# Patient Record
Sex: Male | Born: 1945 | Race: White | Hispanic: No | Marital: Married | Smoking: Former smoker
Health system: Southern US, Community
[De-identification: ages and names within clinical notes are randomized; demographics above are authoritative.]

## PROBLEM LIST (undated history)

## (undated) DIAGNOSIS — R42 Dizziness and giddiness: Secondary | ICD-10-CM

## (undated) DIAGNOSIS — C61 Malignant neoplasm of prostate: Secondary | ICD-10-CM

## (undated) HISTORY — DX: Malignant neoplasm of prostate: C61

## (undated) HISTORY — PX: TONSILECTOMY, ADENOIDECTOMY, BILATERAL MYRINGOTOMY AND TUBES: SHX2538

## (undated) HISTORY — PX: COLONOSCOPY: SHX174

## (undated) HISTORY — PX: OTHER SURGICAL HISTORY: SHX169

## (undated) HISTORY — PX: CATARACT EXTRACTION: SUR2

## (undated) HISTORY — PX: DG GALL BLADDER: HXRAD326

## (undated) HISTORY — PX: VASECTOMY: SHX75

## (undated) HISTORY — PX: HEMORRHOID SURGERY: SHX153

## (undated) HISTORY — PX: BICEPS TENDON REPAIR: SHX566

## (undated) HISTORY — DX: Dizziness and giddiness: R42

---

## 1998-05-18 ENCOUNTER — Encounter: Payer: Self-pay | Admitting: Neurosurgery

## 1998-05-18 ENCOUNTER — Ambulatory Visit (HOSPITAL_COMMUNITY): Admission: RE | Admit: 1998-05-18 | Discharge: 1998-05-18 | Payer: Self-pay | Admitting: Neurosurgery

## 2001-04-05 ENCOUNTER — Encounter: Payer: Self-pay | Admitting: Gastroenterology

## 2001-04-05 ENCOUNTER — Ambulatory Visit (HOSPITAL_COMMUNITY): Admission: RE | Admit: 2001-04-05 | Discharge: 2001-04-05 | Payer: Self-pay | Admitting: Gastroenterology

## 2001-04-19 ENCOUNTER — Ambulatory Visit (HOSPITAL_COMMUNITY): Admission: RE | Admit: 2001-04-19 | Discharge: 2001-04-19 | Payer: Self-pay | Admitting: Gastroenterology

## 2001-04-19 ENCOUNTER — Encounter: Payer: Self-pay | Admitting: Gastroenterology

## 2001-05-03 ENCOUNTER — Ambulatory Visit (HOSPITAL_COMMUNITY): Admission: RE | Admit: 2001-05-03 | Discharge: 2001-05-03 | Payer: Self-pay | Admitting: Gastroenterology

## 2001-11-05 ENCOUNTER — Encounter: Payer: Self-pay | Admitting: General Surgery

## 2001-11-05 ENCOUNTER — Encounter: Admission: RE | Admit: 2001-11-05 | Discharge: 2001-11-05 | Payer: Self-pay | Admitting: General Surgery

## 2002-12-05 ENCOUNTER — Ambulatory Visit: Admission: RE | Admit: 2002-12-05 | Discharge: 2003-03-05 | Payer: Self-pay | Admitting: Radiation Oncology

## 2003-02-28 ENCOUNTER — Emergency Department (HOSPITAL_COMMUNITY): Admission: EM | Admit: 2003-02-28 | Discharge: 2003-02-28 | Payer: Self-pay | Admitting: Emergency Medicine

## 2007-09-18 ENCOUNTER — Encounter (INDEPENDENT_AMBULATORY_CARE_PROVIDER_SITE_OTHER): Payer: Self-pay | Admitting: Surgery

## 2007-09-18 ENCOUNTER — Ambulatory Visit (HOSPITAL_COMMUNITY): Admission: RE | Admit: 2007-09-18 | Discharge: 2007-09-19 | Payer: Self-pay | Admitting: Surgery

## 2008-11-27 ENCOUNTER — Ambulatory Visit (HOSPITAL_COMMUNITY): Admission: RE | Admit: 2008-11-27 | Discharge: 2008-11-27 | Payer: Self-pay | Admitting: Family Medicine

## 2009-01-28 ENCOUNTER — Encounter (INDEPENDENT_AMBULATORY_CARE_PROVIDER_SITE_OTHER): Payer: Self-pay | Admitting: Surgery

## 2009-01-28 ENCOUNTER — Ambulatory Visit (HOSPITAL_COMMUNITY): Admission: RE | Admit: 2009-01-28 | Discharge: 2009-01-28 | Payer: Self-pay | Admitting: Surgery

## 2009-02-01 ENCOUNTER — Inpatient Hospital Stay (HOSPITAL_COMMUNITY): Admission: EM | Admit: 2009-02-01 | Discharge: 2009-02-03 | Payer: Self-pay | Admitting: Emergency Medicine

## 2009-02-01 ENCOUNTER — Ambulatory Visit: Payer: Self-pay | Admitting: Family Medicine

## 2009-04-16 ENCOUNTER — Ambulatory Visit (HOSPITAL_BASED_OUTPATIENT_CLINIC_OR_DEPARTMENT_OTHER): Admission: RE | Admit: 2009-04-16 | Discharge: 2009-04-17 | Payer: Self-pay | Admitting: Urology

## 2010-10-08 LAB — URINALYSIS, ROUTINE W REFLEX MICROSCOPIC
Glucose, UA: NEGATIVE mg/dL
Ketones, ur: 15 mg/dL — AB
Specific Gravity, Urine: 1.012 (ref 1.005–1.030)
pH: 5.5 (ref 5.0–8.0)

## 2010-10-08 LAB — URINE MICROSCOPIC-ADD ON

## 2010-10-08 LAB — COMPREHENSIVE METABOLIC PANEL
ALT: 33 U/L (ref 0–53)
AST: 22 U/L (ref 0–37)
Albumin: 3.6 g/dL (ref 3.5–5.2)
Alkaline Phosphatase: 70 U/L (ref 39–117)
BUN: 46 mg/dL — ABNORMAL HIGH (ref 6–23)
Chloride: 105 mEq/L (ref 96–112)
GFR calc Af Amer: 16 mL/min — ABNORMAL LOW (ref >60)
Potassium: 4.2 mEq/L (ref 3.5–5.1)
Sodium: 136 mEq/L (ref 135–145)
Total Bilirubin: 1.3 mg/dL — ABNORMAL HIGH (ref 0.3–1.2)

## 2010-10-08 LAB — BASIC METABOLIC PANEL
BUN: 11 mg/dL (ref 6–23)
CO2: 24 mEq/L (ref 19–32)
Calcium: 8.2 mg/dL — ABNORMAL LOW (ref 8.4–10.5)
Chloride: 106 mEq/L (ref 96–112)
Creatinine, Ser: 1.03 mg/dL (ref 0.4–1.5)
GFR calc Af Amer: >60 mL/min (ref >60)
GFR calc non Af Amer: >60 mL/min (ref >60)
Glucose, Bld: 96 mg/dL (ref 70–99)
Potassium: 4.2 mEq/L (ref 3.5–5.1)
Sodium: 136 mEq/L (ref 135–145)

## 2010-10-08 LAB — CBC
HCT: 39.2 % (ref 39.0–52.0)
HCT: 41.8 % (ref 39.0–52.0)
Hemoglobin: 13.5 g/dL (ref 13.0–17.0)
MCHC: 34.5 g/dL (ref 30.0–36.0)
MCV: 91.4 fL (ref 78.0–100.0)
MCV: 91.9 fL (ref 78.0–100.0)
Platelets: 165 10*3/uL (ref 150–400)
Platelets: 178 10*3/uL (ref 150–400)
Platelets: 193 10*3/uL (ref 150–400)
RBC: 4.26 MIL/uL (ref 4.22–5.81)
RBC: 4.51 MIL/uL (ref 4.22–5.81)
RDW: 13.2 % (ref 11.5–15.5)
WBC: 10.1 10*3/uL (ref 4.0–10.5)
WBC: 6 10*3/uL (ref 4.0–10.5)
WBC: 6.2 10*3/uL (ref 4.0–10.5)

## 2010-10-08 LAB — PROTIME-INR
INR: 1.2 (ref 0.00–1.49)
Prothrombin Time: 15.5 seconds — ABNORMAL HIGH (ref 11.6–15.2)

## 2010-10-08 LAB — URINE CULTURE
Colony Count: NO GROWTH
Culture: NO GROWTH

## 2010-10-08 LAB — DIFFERENTIAL
Basophils Absolute: 0 10*3/uL (ref 0.0–0.1)
Basophils Relative: 0 % (ref 0–1)
Eosinophils Absolute: 0.2 10*3/uL (ref 0.0–0.7)
Eosinophils Relative: 2 % (ref 0–5)
Lymphocytes Relative: 5 % — ABNORMAL LOW (ref 12–46)
Lymphs Abs: 0.5 10*3/uL — ABNORMAL LOW (ref 0.7–4.0)
Monocytes Absolute: 0.7 10*3/uL (ref 0.1–1.0)
Monocytes Relative: 7 % (ref 3–12)
Neutro Abs: 8.8 10*3/uL — ABNORMAL HIGH (ref 1.7–7.7)
Neutrophils Relative %: 86 % — ABNORMAL HIGH (ref 43–77)

## 2010-10-08 LAB — RENAL FUNCTION PANEL
Calcium: 8.2 mg/dL — ABNORMAL LOW (ref 8.4–10.5)
Creatinine, Ser: 1.24 mg/dL (ref 0.4–1.5)
GFR calc Af Amer: >60 mL/min (ref >60)
GFR calc non Af Amer: 59 mL/min — ABNORMAL LOW (ref >60)
Phosphorus: 2.3 mg/dL (ref 2.3–4.6)
Sodium: 139 mEq/L (ref 135–145)

## 2010-10-08 LAB — APTT: aPTT: 31 seconds (ref 24–37)

## 2010-10-09 LAB — COMPREHENSIVE METABOLIC PANEL
Albumin: 3.9 g/dL (ref 3.5–5.2)
Alkaline Phosphatase: 69 U/L (ref 39–117)
BUN: 11 mg/dL (ref 6–23)
CO2: 22 mEq/L (ref 19–32)
Chloride: 110 mEq/L (ref 96–112)
Creatinine, Ser: 1.01 mg/dL (ref 0.4–1.5)
GFR calc non Af Amer: >60 mL/min (ref >60)
Glucose, Bld: 81 mg/dL (ref 70–99)
Potassium: 4.2 mEq/L (ref 3.5–5.1)
Total Bilirubin: 0.8 mg/dL (ref 0.3–1.2)

## 2010-10-09 LAB — DIFFERENTIAL
Basophils Absolute: 0 10*3/uL (ref 0.0–0.1)
Basophils Relative: 0 % (ref 0–1)
Lymphocytes Relative: 19 % (ref 12–46)
Monocytes Absolute: 0.6 10*3/uL (ref 0.1–1.0)
Neutro Abs: 4 10*3/uL (ref 1.7–7.7)
Neutrophils Relative %: 68 % (ref 43–77)

## 2010-10-09 LAB — CBC
HCT: 43.5 % (ref 39.0–52.0)
Hemoglobin: 14.9 g/dL (ref 13.0–17.0)
MCV: 90.8 fL (ref 78.0–100.0)
RBC: 4.79 MIL/uL (ref 4.22–5.81)
WBC: 5.9 10*3/uL (ref 4.0–10.5)

## 2010-10-13 IMAGING — CR DG CHEST 2V
2 series · 2 of 2 positions shown · non-contrast
Comparison: None

CLINICAL DATA: Preop radiograph.  Laparoscopic cholecystectomy.

CHEST - 2 VIEW

[view not recorded (1 of 2)]
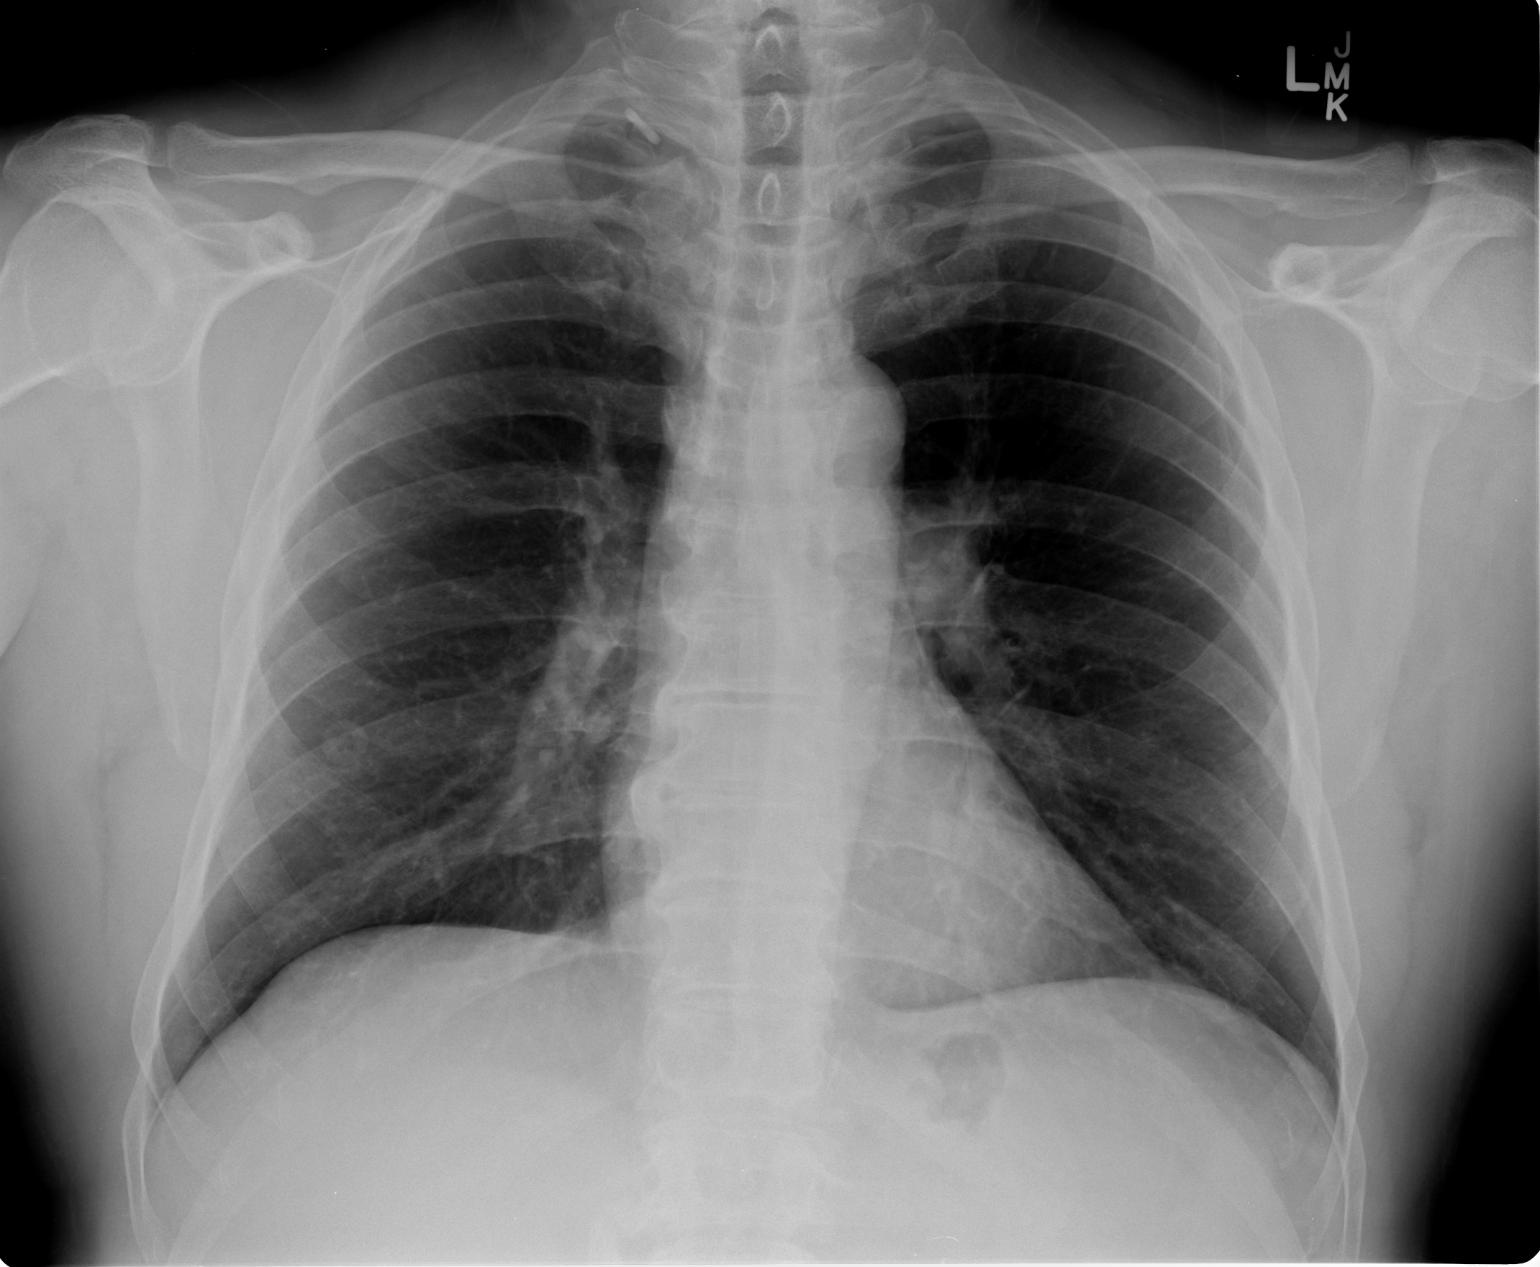

[view not recorded (2 of 2)]
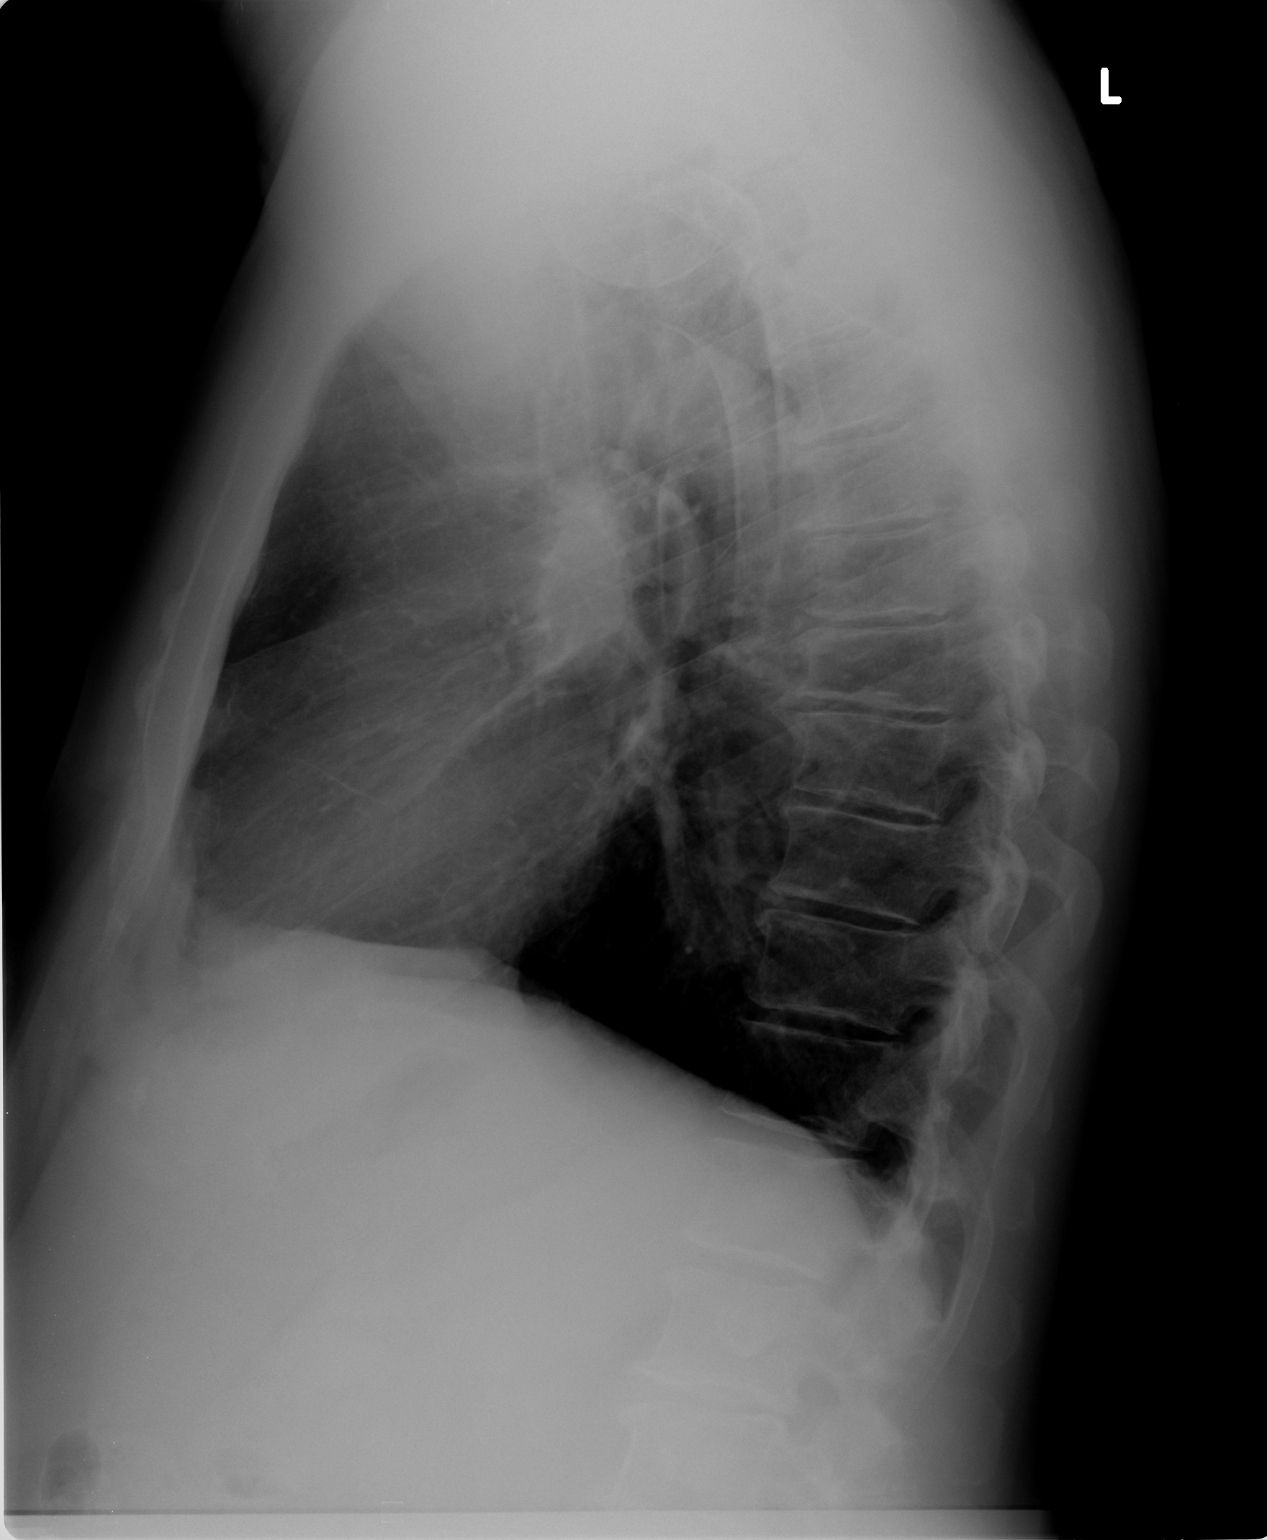

[2 of 2 positions shown; findings below may reference images not displayed]

FINDINGS: The heart size and mediastinal contours are within normal
limits.  Both lungs are clear.  The visualized skeletal structures
are unremarkable.
IMPRESSION: No acute findings.

## 2010-10-15 IMAGING — RF DG CHOLANGIOGRAM OPERATIVE
1 series · 6 of 6 positions shown · non-contrast
Comparison: none

CLINICAL DATA: Cholecystectomy

INTRAOPERATIVE CHOLANGIOGRAM
TECHNIQUE: Multiple fluoroscopic spot radiographs were obtained
during intraoperative cholangiogram and are submitted for
interpretation post-operatively.
Fluoroscopy Time: 9 seconds

[Series 1: run · 3 acquisitions, 6 frames shown]
[im 1/3]
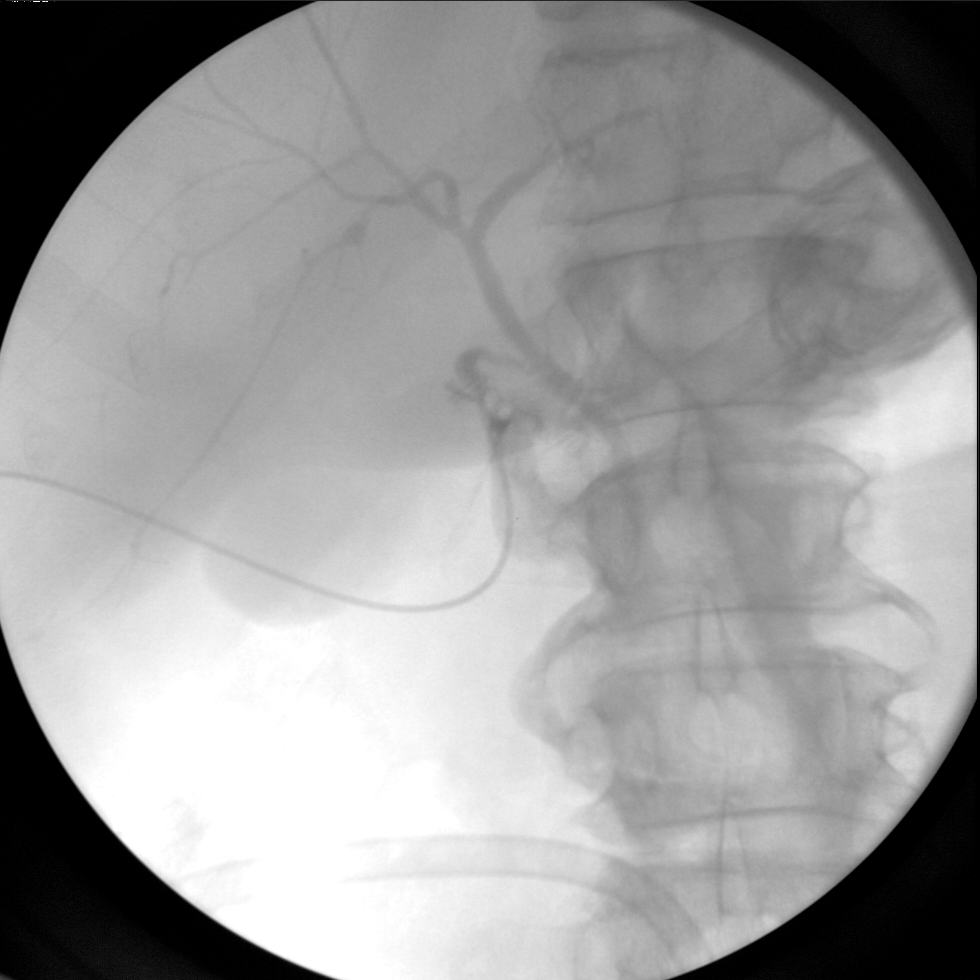
[im 1/3]
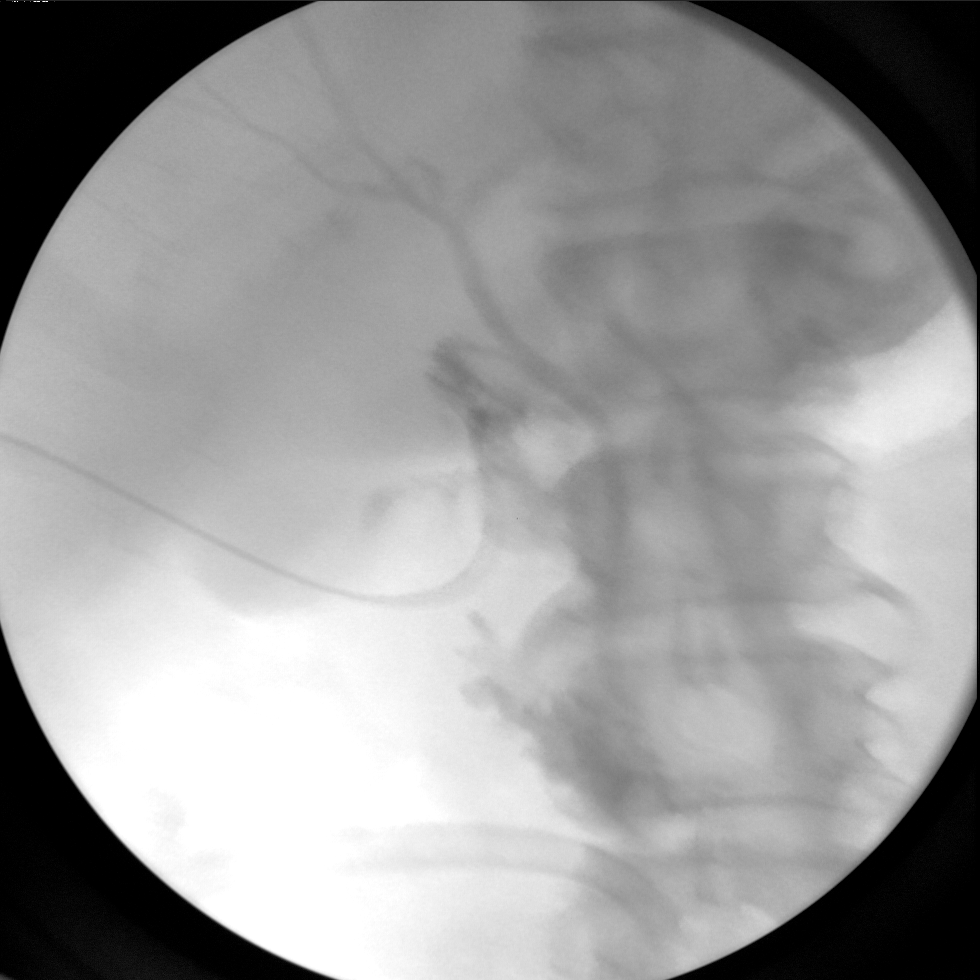
[im 1/3]
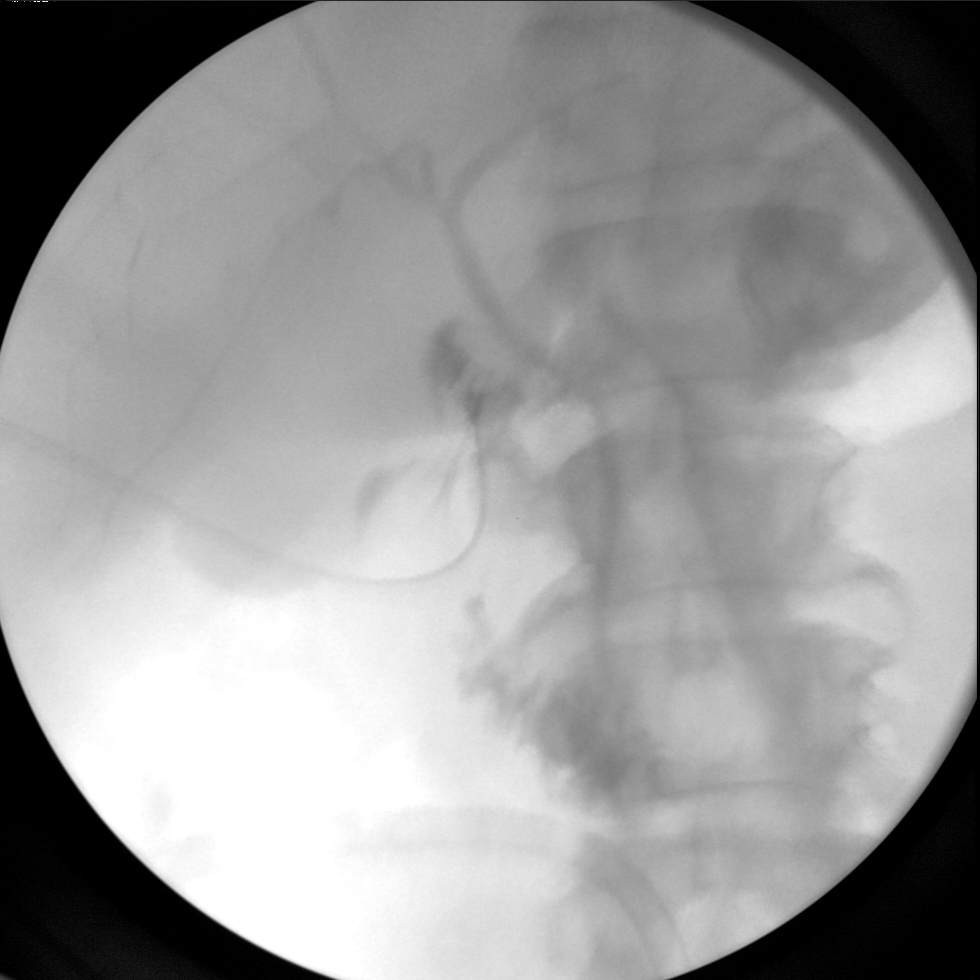
[im 1/3]
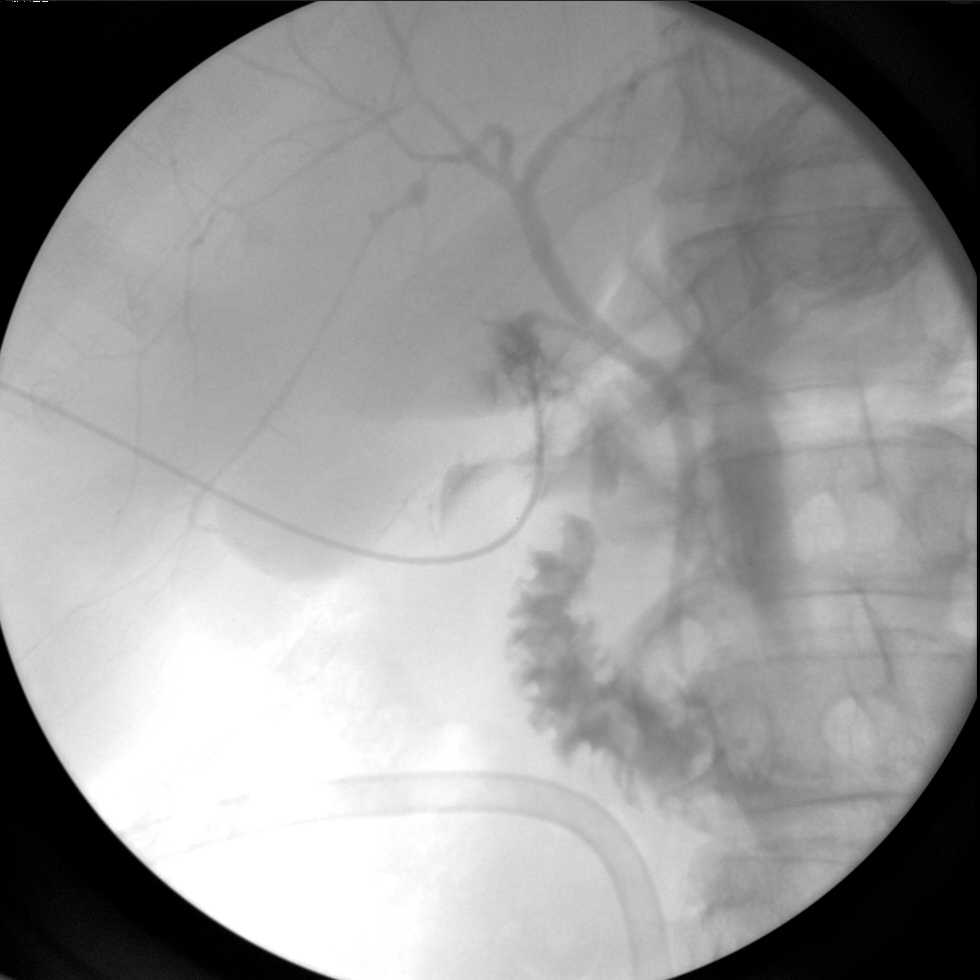
[im 2/3]
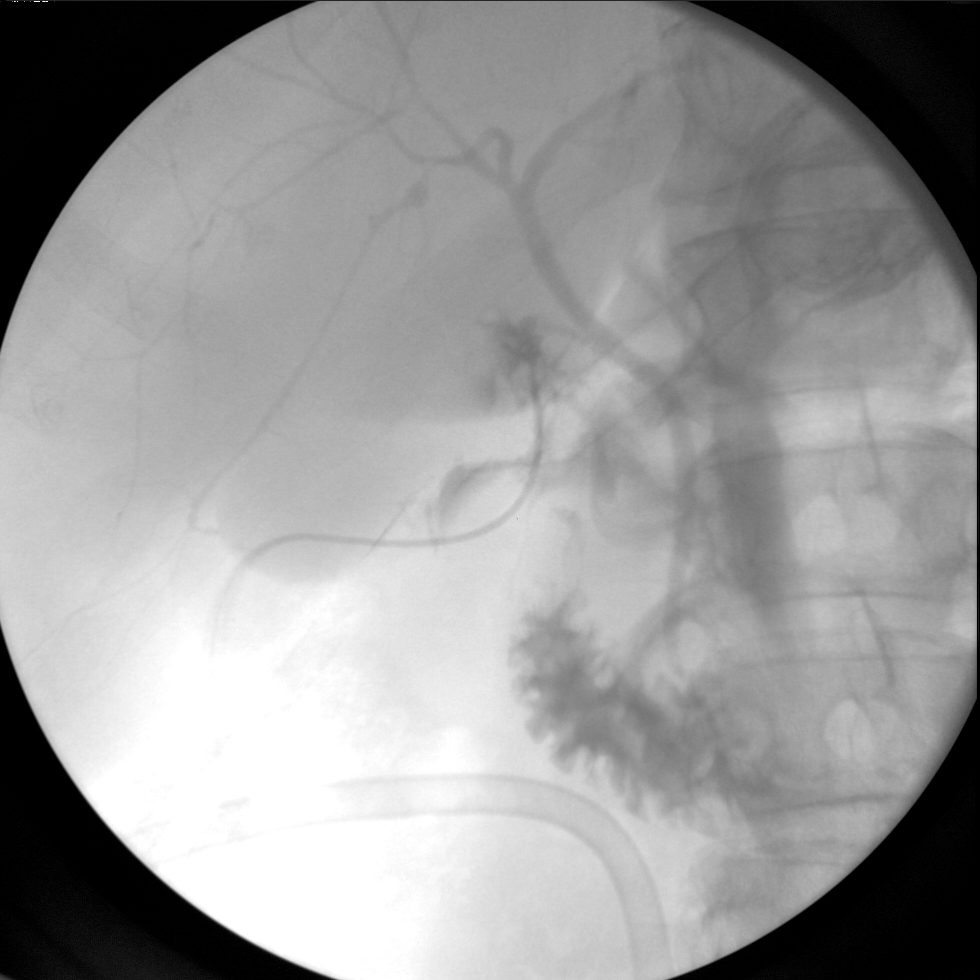
[im 3/3]
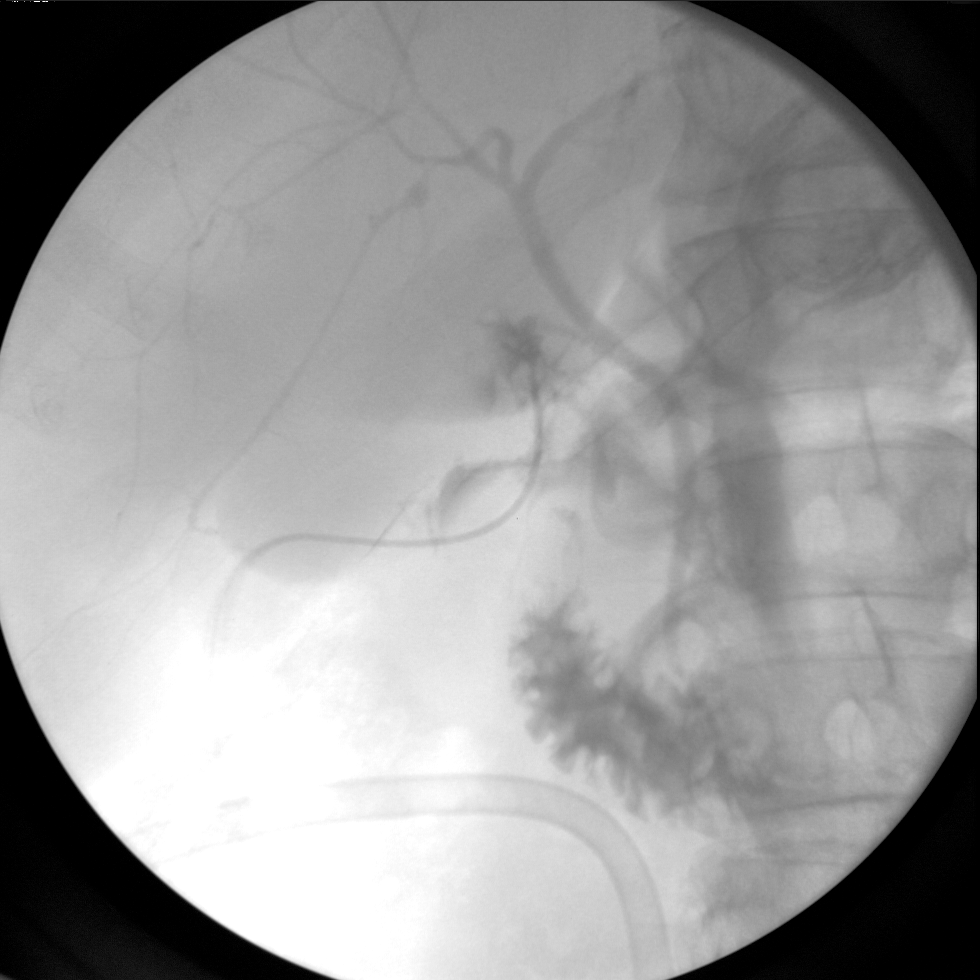

[6 of 6 positions shown; findings below may reference images not displayed]

FINDINGS: Contrast fills the biliary tree and duodenum.  No filling
defects are present in the common bile duct or common hepatic duct
to suggest duct stones.
IMPRESSION: Biliary system is patent.  No definite duct stones.

## 2010-11-15 NOTE — Discharge Summary (Signed)
NAMEYUTO, CAJUSTE               ACCOUNT NO.:  [id number]   MEDICAL RECORD NO.:  [id number]          PATIENT TYPE:  INP   LOCATION:  5120                         FACILITY:  [facility]   PHYSICIAN:  Wayne A. Sheffield Slider, M.D.    DATE OF BIRTH:  1945/08/30   DATE OF ADMISSION:  02/01/2009  DATE OF DISCHARGE:  02/03/2009                               DISCHARGE SUMMARY   PRIMARY CARE PHYSICIAN:  Unassigned.   DISCHARGE DIAGNOSES:  1. Acute renal failure.  2. Urinary retention.  3. Constipation.   DISCHARGE MEDICATIONS:  1. Flomax 0.8 mg p.o. daily.  2. Avodart 0.5 mg p.o. daily.   CONSULTATIONS:  None.   PROCEDURE:  Foley catheter placed February 01, 2009.   LABORATORY:  1. Creatinine 4.60, total bilirubin 1.3, urine ketones 15, urine blood      in large amounts, urine leukocytes trace.  2. 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   BRIEF HOSPITAL COURSE:  1. Acute renal failure.  The patient has significant history of severe      BPH and prostate cancer.  He is status post a laparoscopic      cholecystectomy on January 28, 2009.  The patient was admitted to the      hospital with severe abdominal pain, reduced urine output and      constipation.  We were concerned about a urinary obstruction.  Labs      done were a CMET, CBC with differential, urinalysis, PT, PTT and      PSA.  The patient was also catheterized with a Foley.  As per      laboratory results and Foley catheterization, which allowed the      patient to properly urinate, the patient was determined to be in      obstructive acute renal failure.  We hydrated the patient with IV      fluids and followed up on a renal ultrasound.  Over the next few      days, the patient's creatinine level returned to normal and the      patient was able to tolerate a diet.  The patient is to follow up      with Urology.  2. Urinary retention.  As mentioned above, the patient was able to      void  with the catheter.  We desired for the patient to keep Foley      in to help void due to his possible reduced tone; however, the      patient refused to keep the Foley in.  We discontinued his Foley in      the hospital and made a follow-up appointment with his urologist.  3. Constipation.  The patient had been constipated since a week before      emergency department presentation.  On day 2 of the hospital stay,      the patient began to defecate as his urinary retention was being      resolved.   DISCHARGE INSTRUCTIONS:  1. Activity:  No restrictions.  2. Diet:  No  restrictions.  3. No wound care.  4. The patient is to follow up with his urologist, [facility] in [place].   No pending results or issues to be followed up after discharge.   FOLLOW-UP APPOINTMENTS:  The patient has an appointment with Cammy Copa, nurse practitioner at [facility] on  Thursday, February 04, 2009 at 9:15 a.m.   DISPOSITION:  The patient was discharged home in stable condition.      Magnus Ivan, MD  Electronically Signed      Arnette Norris. Sheffield Slider, M.D.  Electronically Signed    VW/MEDQ  D:  02/03/2009  T:  02/03/2009  Job:  [id number]   cc:   [facility]

## 2010-11-15 NOTE — H&P (Signed)
NAMEJUNIPER, SNYDERS NO.:  [id number]   MEDICAL RECORD NO.:  [id number]          PATIENT TYPE:  INP   LOCATION:  1844                         FACILITY:  [facility]   PHYSICIAN:  Wayne A. Sheffield Slider, M.D.    DATE OF BIRTH:  January 17, 1946   DATE OF ADMISSION:  02/01/2009  DATE OF DISCHARGE:                              HISTORY & PHYSICAL   CHIEF COMPLAINT:  Acute renal failure, urinary retention and  constipation.   PRIMARY CARE PHYSICIAN:  Unassigned.   UROLOGIST:  Lucrezia Starch. Earlene Plater, MD   SURGEON:  Clovis Pu. Cornett, MD   HISTORY OF PRESENT ILLNESS:  This is a 65 year old male with a history  of severe BPH, prostate cancer, status post laparoscopic cholecystectomy  for biliary dyskinesia on July 29, who presented to the ED today with a  5-day history of worsening abdominal pain, decreased urine output and  decreased stools.  Normally the patient states he has decreased urine  output but over the last few days, it has worsened.  He is still passing  gas.  He has also had decreased p.o. intake secondary to fear of  worsening abdominal pain but not really decreased appetite.  The patient  endorses subjective fevers and nausea but no vomiting.  Denies diarrhea  but does have constipation.  His abdominal pain is characterized as  throughout his abdomen and more pressure-like sensation.  He also  endorses history of flank pain in the past several days but none  currently.  He states his abdominal pain is worsened to the point of not  being able to sleep over the last 2 days and it got so severe that he  came to the ER today to be evaluated.  In the ER he was noted to have  abdominal distention and was catheterized with an immediate voiding of  950 cc and over the last several hours, he has had a total of 2 liters  out.  Per the patient, normally it takes him about several hours, over  5, to fully empty his bladder.  As far as his prostate cancer, his  current PSA is 3 but per  patient previously has been up to 13.  The  patient denies any NSAID use and is not currently on an ACE inhibitor.   REVIEW OF SYSTEMS:  As above, significant for fevers, chills, nausea and  constipation and abdominal pain.  Negative for diarrhea, vomiting, joint  pain, muscle pain, headache, vision changes, chest pain or tightness,  shortness of breath or cough.   PAST MEDICAL HISTORY:  1. Severe BPH.  2. Prostate cancer.  PSA currently at 3.  no history of prostate      surgery.  3. History of TIA.   PAST SURGICAL HISTORY:  1. Appendectomy in March 2009.  2. Laparoscopic cholecystectomy last month.  3. Vertebral fusions in the past. Cervical.  4. Ear surgery in the past.  5. Hemorrhoidectomy x2.   The patient denies prostate surgery.   ALLERGIES:  No significant drug allergies.   MEDICATIONS:  1. Flomax 0.4 mg daily.  2. Avodart 0.5  mg daily  3. Multivitamin and supplements.   FAMILY HISTORY:  Significant for a brother with diabetes.  Parents are  alive and healthy.   SOCIAL HISTORY:  The patient is a Market researcher.  He lives in  [place] with his wife.  He has 3 daughters in the area as well.  He  has a remote history of smoking for 3 years but quit over 30 years ago.  He endorses occasional alcohol use and no recreational drugs.   PHYSICAL EXAMINATION:  Temperature 97.5, pulse rate 72-85, respiratory  rate 20, blood pressure 130-160 over 71-89.  O2 saturation 98% on room  air.  GENERAL:  Pleasant, talkative, in no acute distress.  HEENT:  Pupils equally round and reactive to light.  Extraocular  movements intact.  Moist mucous membranes.  No pharyngeal erythema or  edema.  Cranial nerves II-XII intact.  CARDIOVASCULAR:  Normal S1, S2.  No murmurs, rubs or gallops.  PULMONARY:  Clear to auscultation bilaterally.  No crackles or wheezing.  ABDOMEN:  Soft, tender to palpation suprapubically mostly.  No CVA  tenderness.  Normoactive bowel sounds.  Incision  is healing well without  erythema or induration.  EXTREMITIES:  No clubbing, cyanosis or edema.  No pain.  SKIN:  Ecchymoses suprapubically, otherwise no rash or lesions noted.   LABORATORY DATA:  Urinalysis showing specific gravity of 1.012, ketones  of 15, large blood and trace leukocyte esterase.  White count of 10.1,  hemoglobin 14.5, platelets 193.  Sodium 136, potassium 4.2, chloride  105, bicarb 22, BUN 46, creatinine 4.60, increased from 1.01 on July 27,  glucose 101, total bilirubin 1.3.  LFTs within normal limits.   IMAGING:  Acute abdominal series showing left subsegmental atelectasis,  question of constipation.  No acute findings, however.  There is also  noted a right iliac bone lytic lesion of undetermined significance.   ASSESSMENT/PLAN:  In short, this is a 65 year old male with history of  severe benign prostatic hypertrophy and prostate cancer who presents  with urinary retention and acute renal failure.  1. Acute renal failure, likely secondary to obstruction from acute      urinary retention status post surgery.  I will check a renal      ultrasound and consult his urologist to let him know the patient is      in the hospital.  The patient denies nonsteroidal anti-inflammatory      drug or angiotensin-converting enzyme inhibitor use.  Mr. Dannemiller      has already had a 1-liter bolus in the ER.  I will continue gentle      hydration with 125 cc normal saline per hour.  If it is due to      urinary retention, I would expect his acute renal failure to slowly      but steadily improve and return back to baseline.  Reviewing      records, the patient did have a nuclear medicine imaging of his      gallbladder done in May of this year and then an intraoperative      cholangiogram with some amount of contrast dye load, but I would      not expect such severe renal failure from this alone.  Likely his      benign prostatic hypertrophy is contributing more, causing acute       urinary retention leading to obstruction and renal failure.  2. Urinary retention.  We will leave the Foley in place  for now and      will await urology recommendations.  3. Severe benign prostatic hypertrophy.  Continue Flomax.  Consider      increasing to 0.8 daily, but we will leave this to the discretion      of his urologist.  4. Prostate cancer with questionable new right iliac lytic lesion on      acute abdominal series.  We will make urology aware of this.  5. Fluids, electrolytes, and nutrition, gastrointestinal.  Again,      gently hydrate the patient with normal saline.  I will start the      patient on clear liquid diet and see how he tolerates this.      Consider advancing as tolerated in the next day or two.  Recheck      creatinine tomorrow morning with a.m. labs.  6. Prophylaxis.  Heparin and proton pump inhibitor.  7. Disposition.  Awaiting resolution of acute renal failure and      urology recommendations.      Eustaquio Boyden, MD  Electronically Signed      Arnette Norris. Sheffield Slider, M.D.  Electronically Signed    JG/MEDQ  D:  02/01/2009  T:  02/01/2009  Job:  [id number]

## 2010-11-15 NOTE — H&P (Signed)
NAMEGARON, MELANDER NO.:  [id number]   MEDICAL RECORD NO.:  [id number]          PATIENT TYPE:  OIB   LOCATION:  1522                         FACILITY:  [facility]   PHYSICIAN:  Velora Heckler, MD      DATE OF BIRTH:  1945-11-13   DATE OF ADMISSION:  09/18/2007  DATE OF DISCHARGE:                              HISTORY & PHYSICAL   CHIEF COMPLAINT:  Early appendicitis.   HISTORY OF PRESENT ILLNESS:  Mike James is a 65 year old white male  from [place], [place], who presents to our office on referral  from [facility] with possible early appendicitis.  The patient had been  ill for approximately 5 days.  The patient presented with right lower  quadrant abdominal pain, nausea, and chills.  He monitored this at home  for 2 days.  He was seen on Sunday, March 15 by his primary physician at  [facility].  Symptoms persisted, and laboratory studies were obtained.  He was told that he had a marginal white blood cell count.  He was sent  on March 17 for CT scan of the abdomen and pelvis at [facility]  which showed mild inflammatory changes around the appendix consistent  with possible early acute appendicitis.  The patient was referred to our  office, today, for surgical assessment.   PAST MEDICAL HISTORY:  1. History of benign prostatic hypertrophy.  2. History of hemorrhoidectomy.  3. Status post cervical fusion in 49 in [place], [place].   MEDICATIONS:  Avodart and vitamin supplements.   ALLERGIES:  None known.   SOCIAL HISTORY:  The patient is married.  He does have children.  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  He does not  smoke.  He drinks alcohol on occasion.   FAMILY HISTORY:  Noncontributory.   REVIEW OF SYSTEMS:  A 65 system review documented in our medical record  with no further no significant findings except as noted above.   PHYSICAL EXAMINATION:  GENERAL:  A 65 year old well-developed, well-  nourished  white male in mild discomfort.  Temperature 97.4.  HEENT:  Shows him to be normocephalic, atraumatic.  Sclerae clear.  Conjunctiva clear.  Pupils equal and reactive.  Dentition fair.  Mucous  membranes moist.  Voice normal.  NECK:  Palpation of the neck shows no lymphadenopathy.  No tenderness.  No thyroid nodularity.  LUNGS:  Clear to auscultation bilaterally.  CARDIAC:  Exam shows regular rate and rhythm without murmur.  Peripheral  pulses are full.  EXTREMITIES:  Nontender without edema.  ABDOMEN:  Soft without distention.  There are a few bowel sounds  present.  There is a reducible umbilical hernia which is nontender.  Upper abdomen is soft, nontender without hepatosplenomegaly.  There is  tenderness to palpation in the right lower quadrant with voluntary  guarding.  There is rebound tenderness in the right lower quadrant.  NEUROLOGICALLY:  The patient is alert and oriented.  There is no sign of  tremor.   DATA REVIEWED:  Laboratory studies are pending at the time of dictation.  They will  be drawn in the emergency department of [facility].   Radiographic studies CT scan of the abdomen and pelvis from September 17, 2007, at [facility] showing a in the right lower quadrant the  appendix appears minimally inflamed and  abnormally dilated, and is  short and length.   IMPRESSION:  Probable acute appendicitis.   PLAN:  I have discussed the case with my partner, Dr. Harriette Bouillon,  who is on call at [facility].  He has agreed to see  the patient.  The patient will be sent to same-day admissions for  laboratory studies, and initiation of intravenous fluids, intravenous  pain medication, and intravenous antibiotics.  Dr. Luisa Hart agrees with  me that the patient requires surgical evaluation with diagnostic  laparoscopy, and probable laparoscopic appendectomy.   Risk and benefits of the procedure been discussed with the patient.  He   understands and agrees to proceed.      Velora Heckler, MD  Electronically Signed     TMG/MEDQ  D:  09/18/2007  T:  09/19/2007  Job:  [id number]   cc:   Gabriel Earing, M.D.  Fax: [phone]   Clovis Pu. Cornett, M.D.  [address] [place] [address]  [place] [address]

## 2010-11-15 NOTE — Discharge Summary (Signed)
NAMEKINGSTIN, HEIMS NO.:  [id number]   MEDICAL RECORD NO.:  [id number]          PATIENT TYPE:  OIB   LOCATION:  1522                         FACILITY:  [facility]   PHYSICIAN:  Clovis Pu. Cornett, M.D.DATE OF BIRTH:  Mar 08, 1946   DATE OF ADMISSION:  09/18/2007  DATE OF DISCHARGE:  09/19/2007                               DISCHARGE SUMMARY   ADMISSION DIAGNOSIS:  Acute appendicitis.   DISCHARGE DIAGNOSIS:  Acute appendicitis.   BRIEF HISTORY:  The patient is a 65 year old male who has had left lower  quadrant pain for three to four days.  He was seen yesterday at the  urgent office with Dr. Gerrit Friends.  CT scan was obtained at [facility] the  day before which showed acute appendicitis prior to referral to our  office.  He was then sent to [facility] and taken to the  operating room for laparoscopic appendectomy for acute appendicitis.   HOSPITAL COURSE:  Hospital course was unremarkable.  On postoperative  day 1 he was ambulating.  He was having some problems with urinary  frequency.  This is now new for him, given his history of benign  prostatic hypertrophy.  He is on Avodart and I went ahead and placed him  on Flomax to see if we could help his bladder empty better.  I told him  if he could do that we would discharge him to home later that day which  we did.  He was tolerating his diet, ambulating well, was afebrile and  was ready to go home.   DISCHARGE INSTRUCTIONS:  I will have him come back to see me in two to  three weeks.  I will give him a 5 day supply of Flomax to try to see if  this helps his bladder empty better.  He is already on Avodart for  benign prostatic hypertrophy.  He will be given a script for Vicodin ES  one to two tablets q.4h. p.r.n. pain as  needed and ambulate as  tolerated.  He will refrain from lifting and driving for a week and he  may shower and resume a regular diet.   CONDITION ON DISCHARGE:  Improved.      Thomas A.  Cornett, M.D.  Electronically Signed     TAC/MEDQ  D:  09/19/2007  T:  09/19/2007  Job:  [id number]

## 2010-11-15 NOTE — Op Note (Signed)
NAMEISLAM, Mike James NO.:  [id number]   MEDICAL RECORD NO.:  [id number]          PATIENT TYPE:  AMB   LOCATION:  DAY                          FACILITY:  [facility]   PHYSICIAN:  Thomas A. Cornett, M.D.DATE OF BIRTH:  Jun 23, 1946   DATE OF PROCEDURE:  09/18/2007  DATE OF DISCHARGE:                               OPERATIVE REPORT   PREOPERATIVE DIAGNOSIS:  Acute appendicitis.   POSTOPERATIVE DIAGNOSIS:  Acute appendicitis.   PROCEDURE:  Laparoscopic appendectomy.   SURGEON:  Harriette Bouillon, M.D.   ASSISTANT:  OR staff.   ANESTHESIA:  General endotracheal anesthesia with 0.25% Sensorcaine.   ESTIMATED BLOOD LOSS:  20 mL.   SPECIMEN:  Appendix to pathology.   INDICATIONS FOR PROCEDURE:  The patient is a 65 year old male who has  had a 5-day history of abdominal pain.  He was seen today in the office  by Dr. Gerrit Friends.  He had a CT done yesterday at [facility], which showed  appendicitis but was not referred to until today for evaluation.  He is  tender in his right lower quadrant and a CT scan shows a swollen,  inflamed appendix.  He presents for laparoscopic appendectomy.   DESCRIPTION OF PROCEDURE:  The patient was brought to the operating  room, placed supine.  Left arm was tucked and the abdomen was then  prepped and draped in sterile fashion after induction of general  anesthesia.  Foley catheter was placed.  He had a small periumbilical  hernia of note.  A curvilinear incision was made above the umbilicus.  Dissection was carried down into the abdominal cavity through the  umbilical defect quite easily.  Pursestring suture of 0 Vicryl was  placed around the defect and a 12-mm Hassan cannula was placed under  direct vision.  Pneumoperitoneum was created to 15 mmHg with CO2 and a  laparoscope was placed.  Upon inspection there were some adhesions to  the descending colon and cecum area.  I placed a 5-mm port in the left  lower quadrant.  I used Endo shears  and took down these adhesions to  better mobilize the cecum and ascending colon.  The appendix was  inflamed and the tip was stuck down toward the region near the bladder.  I grabbed the appendix and was able to pull this off quite easily.  I  did not see any leakage of urine or anything from that from the bladder  itself.  The appendix was swollen, inflamed, looked like it had  appendicitis.  The terminal ileum, cecum looked normal.  We then placed  a second 5 mm port in the right upper quadrant.  Using 5 mm scope I used  the harmonic scalpel and took the mesoappendix down until I got to the  base of the appendix.  Once the mesoappendix was taken down, a GIA 45  stapling device two cartridges was placed across the base the appendix  and fired and we emptied the appendix.  Appendix was then placed in  EndoCatch bag and passed off the field.  We had some oozing from the  appendiceal stump.  I controlled it with cautery and a small piece of  Surgicel.  There is some adhesions oozing from the colon itself.  These  were controlled with the harmonic scalpel initially and then we placed a  small piece of Surgicel over this.  Irrigation was used and this was  suctioned out.  At this point we suctioned our irrigation.  We then  removed our ports with no signs of port site bleeding.  The CO2 was  allowed to escape.  Once the instruments and ports removed, CO2 was  allowed to escape, the umbilical port site was closed with the  pursestring suture of 0 Vicryl, 4-0 Monocryl.  Dermabond was applied to  all wounds.  All final counts of sponge, needle and instruments found be  correct this portion of the case.  The patient was awoke, taken to  recovery in satisfactory condition.  All final counts of sponge, needle  instruments found to be correct this portion of the case.  The patient  was then awoke taken to recovery in satisfactory condition.      Thomas A. Cornett, M.D.  Electronically  Signed     TAC/MEDQ  D:  09/18/2007  T:  09/19/2007  Job:  [id number]

## 2010-11-15 NOTE — Op Note (Signed)
Mike James, Mike James               ACCOUNT NO.:  [id number]   MEDICAL RECORD NO.:  [id number]          PATIENT TYPE:  AMB   LOCATION:  SDS                          FACILITY:  [facility]   PHYSICIAN:  Thomas A. Cornett, M.D.DATE OF BIRTH:  May 02, 1946   DATE OF PROCEDURE:  01/28/2009  DATE OF DISCHARGE:                               OPERATIVE REPORT   PREOPERATIVE DIAGNOSIS:  Biliary dyskinesia.   POSTOPERATIVE DIAGNOSIS:  Biliary dyskinesia.   PROCEDURE:  Laparoscopic cholecystectomy with intraoperative  cholangiogram.   SURGEON:  Thomas A. Cornett, MD   ANESTHESIA:  General endotracheal anesthesia, 0.25% Sensorcaine local.   ESTIMATED BLOOD LOSS:  20 mL.   SPECIMEN:  Gallbladder to Pathology.   ASSISTANT:  Currie Paris, MD   INDICATIONS FOR PROCEDURE:  The patient is a 64 year old male who  presented with right upper quadrant pain, nausea, vomiting.  Workup  revealed a very low ejection fraction of 11% on his HIDA study with CCK.  This is consistent with biliary dyskinesia as well as the symptoms and  recommend a laparoscopic cholecystectomy for this.   DESCRIPTION OF PROCEDURE:  The patient was brought to the operating  room, placed supine.  After induction of general anesthesia, the abdomen  was prepped and draped in a sterile fashion.  A 1-cm infraumbilical  incision was made, after injection of local anesthesia.  Dissection was  carried down to the fascia.  The fascia was opened and Kochers were used  to grab the fascia.  Small hemostat was used to spread apart the  peritoneal lining under direct vision.  Pursestring suture of 0-Vicryl  was then placed.  A 12-mm Hasson cannula was placed under direct vision.  Pneumoperitoneum was created at 15 mmHg of CO2 and laparoscope was  placed.  The patient was placed in reverse Trendelenburg and rolled to  his left.  At this point, a laparoscopy was performed.  He had a  previous laparoscopic appendectomy.  Gallbladder was  distended and had  some signs of chronic inflammation.  A 11-mm subxiphoid port was placed,  two 5-mm ports were placed in right upper quadrant.  Gallbladder was  grabbed by its dome and pushed towards the patient's right shoulder.  Second grasper was used to grab the infundibulum port to the patient's  right lower quadrant.  Cautery was used to score the peritoneal covering  of the gallbladder thus exposing the junction of the cystic duct and  infundibulum of the gallbladder.  We dissected around the cystic duct  without difficulty and placed a clip on the gallbladder side of this.  Small incision was made in the cystic duct and through a separate stab  incision, a Cook cholangiogram catheter was introduced and held in place  by a clip.  Intraoperative cholangiogram was performed, which showed  free flow of contrast down the tortuous cystic duct into the common bile  duct and duodenum.  There was free flow of contrast on the common  hepatic duct into right and left ducts.  No evidence of stones.  There  were some mild extravasation at the catheter insertion  site.  No signs  of obstruction either.  Catheter was removed, the cystic duct stump was  triple clipped, and divided.  Cystic artery was divided between clips  and divided.  There was a small posterior branch of cystic artery  controlled between clips.  Gallbladder was used to get the gallbladder  out of the gallbladder fossa without difficulty with good hemostasis.  There were some spillage of bile from the clip that slipped off from the  gallbladder side, but no stones were observed at this field.  Gallbladder was then placed in EndoCatch bag.  Gallbladder bed was  examined and cautery was used for cauterization of any oozing.  Suction  was then used and all bile or old blood was suctioned out.  Next, the  gallbladder was then extracted through the umbilicus and passed off the  field with the port site of the umbilicus being closed  with the  pursestring suture of 0-Vicryl.  This was done under direct vision.  We  then reinserted the gallbladder bed, found to be hemostatic and  suctioned out any additional fluid.  We then removed our ports under  direct vision observing no signs of organ injury.  These were passed off  the field.  We then removed the subxiphoid port and closed skin with 4-0  Monocryl.  Dermabond was applied.  All final counts, sponge, needle, and  instruments were found to be correct at this portion of the case.  The  patient was awoken and taken to recovery room in satisfactory condition.  All final counts were found to be correct.      Thomas A. Cornett, M.D.  Electronically Signed     TAC/MEDQ  D:  01/28/2009  T:  01/28/2009  Job:  [id number]   cc:   Jeanice Lim, M.D.

## 2010-11-18 NOTE — Procedures (Signed)
[place]  Patient:    Mike James, Mike James Visit Number: [id number] MRN: [id number]          Service Type: Attending:  Anselmo Rod, M.D. Dictated by:   Anselmo Rod, M.D. Proc. Date: 05/03/01   CC:         Davis L. Cloward, M.D., [facility], [facility] [address]   Procedure Report  DATE OF BIRTH:  [birth year].  PROCEDURE:  Colonoscopy.  ENDOSCOPIST:  Anselmo Rod, M.D.  INSTRUMENT USED:  Olympus video colonoscope.  INDICATION FOR PROCEDURE:  A 65 year old white male with a history of blood in stool and family history of colonic polyps in his mother.  PREPROCEDURE PREPARATION:  Informed consent was procured from the patient. The patient was fasted for eight hours prior to the procedure and prepped with a bottle of magnesium citrate and a gallon of NuLytely the night prior to the procedure.  PREPROCEDURE PHYSICAL:  VITAL SIGNS:  The patient had stable vital signs.  NECK:  Supple.  CHEST:  Clear to auscultation.  S1, S2 regular.  ABDOMEN:  Soft with normal bowel sounds.  DESCRIPTION OF PROCEDURE:  The patient was placed in the left lateral decubitus position and sedated with 75 mg of Demerol and 7.5 mg of Versed intravenously.  Once the patient was adequately sedate and maintained on low-flow oxygen and continuous cardiac monitoring, the Olympus video colonoscope was advanced from the rectum to the cecum with slight difficulty secondary to some residual stool in the colon.  There were small internal hemorrhoids appreciated and left-sided diverticulosis.  No masses or polyps were seen.  Very small lesions could have been missed.  The patient tolerated the procedure well without complications.  There was a small anal polyp seen at the anal verge.  IMPRESSION: 1. Small anal polyp seen at the verge. 2. Small internal hemorrhoids. 3. Left-sided diverticulosis.  RECOMMENDATIONS: 1. A high-fiber diet has been advised for the  patient. 2. Surgical evaluation will be set up with Dr. Abbey Chatters to remove the anal    polyp if necessary. 3. Outpatient follow-up in the next two weeks. Dictated by:   Anselmo Rod, M.D. Attending:  Anselmo Rod, M.D. DD:  05/03/01 TD:  05/06/01 Job: [id number]

## 2011-03-20 ENCOUNTER — Inpatient Hospital Stay (HOSPITAL_COMMUNITY)
Admission: RE | Admit: 2011-03-20 | Discharge: 2011-03-21 | DRG: 502 | Disposition: A | Discharge status: Home / Self Care | Payer: Worker's Compensation | Source: Ambulatory Visit | Attending: Orthopedic Surgery | Admitting: Orthopedic Surgery

## 2011-03-20 ENCOUNTER — Ambulatory Visit (HOSPITAL_COMMUNITY): Payer: Worker's Compensation

## 2011-03-20 DIAGNOSIS — M545 Low back pain, unspecified: Secondary | ICD-10-CM | POA: Diagnosis present

## 2011-03-20 DIAGNOSIS — Z8546 Personal history of malignant neoplasm of prostate: Secondary | ICD-10-CM

## 2011-03-20 DIAGNOSIS — M66329 Spontaneous rupture of flexor tendons, unspecified upper arm: Principal | ICD-10-CM | POA: Diagnosis present

## 2011-03-20 DIAGNOSIS — I10 Essential (primary) hypertension: Secondary | ICD-10-CM | POA: Diagnosis present

## 2011-03-20 DIAGNOSIS — M129 Arthropathy, unspecified: Secondary | ICD-10-CM | POA: Diagnosis present

## 2011-03-20 DIAGNOSIS — Z8719 Personal history of other diseases of the digestive system: Secondary | ICD-10-CM

## 2011-03-20 DIAGNOSIS — G8929 Other chronic pain: Secondary | ICD-10-CM | POA: Diagnosis present

## 2011-03-20 LAB — CBC
Hemoglobin: 15.2 g/dL (ref 13.0–17.0)
MCHC: 34.9 g/dL (ref 30.0–36.0)
WBC: 6.5 10*3/uL (ref 4.0–10.5)

## 2011-03-20 LAB — BASIC METABOLIC PANEL
BUN: 17 mg/dL (ref 6–23)
CO2: 17 mEq/L — ABNORMAL LOW (ref 19–32)
Chloride: 103 mEq/L (ref 96–112)
Creatinine, Ser: 1.13 mg/dL (ref 0.50–1.35)

## 2011-03-27 LAB — CBC
HCT: 41.9
Hemoglobin: 14.4
MCHC: 34.4
MCV: 89.3
RDW: 12.7

## 2011-03-27 LAB — BASIC METABOLIC PANEL
CO2: 21
Chloride: 108
Glucose, Bld: 85
Potassium: 3.7
Sodium: 138

## 2011-03-27 LAB — DIFFERENTIAL
Basophils Absolute: 0
Basophils Relative: 0
Eosinophils Relative: 1
Lymphocytes Relative: 15
Monocytes Absolute: 0.5

## 2011-04-12 NOTE — Op Note (Signed)
  NAMEGREYDON, BETKE NO.:  [id number]  MEDICAL RECORD NO.:  [id number]  LOCATION:  3015                         FACILITY:  [facility]  PHYSICIAN:  Burnard Bunting, M.D.    DATE OF BIRTH:  1945-10-24  DATE OF PROCEDURE: DATE OF DISCHARGE:                              OPERATIVE REPORT   PREOPERATIVE DIAGNOSES:  Right distal biceps rupture , chronic.  POSTOPERATIVE DIAGNOSES:  Right distal biceps rupture , chronic.  PROCEDURE PERFORMED:  Right chronic distal biceps rupture repair.  SURGEON:  Marrianne Mood. August Saucer, MD.  ASSISTANTLaural Benes. Su Hilt, PA-C  ANESTHESIA:  General tracheal.  ESTIMATED BLOOD LOSS:  25 mL.  DRAINS:  None.  TOURNIQUET TIME:  49 minutes at .  INDICATIONS:  Mike James is a 65 year old patient with right distal biceps rupture with about a month out and presents now for operative management after explanation of risks and benefits.  PROCEDURE IN DETAIL:  The patient was brought to operating room where general endotracheal anesthesia was induced.  Preoperative antibiotics were administered.  Right arm was prescrubbed with alcohol and Betadine and then prepped with DuraPrep solution and draped in a sterile manner. Time-out  was called.  L-shaped incision was made over the antecubital fossa extending proximally and laterally.  Skin and subcutaneous tissues were sharply divided.  Cephalic vein was identified and protected. Lateral antebrachial cutaneous nerve was also identified and protected as across the field.  The biceps tendon was difficult to find, it retracted and folded in upon itself.  The biceps tendon however was found and it had appropriate length in order to elicit repair.  It's attachment to the antebrachial fascia was released.  In a blunt dissection, the pathway of the bicipital tendon was recreated down to the radial tuberosity, a curved Kelly clamp was placed and exited out proximally and laterally.  Great care was taken  never to expose the ulna.  Muscle splitting incision was then made.  Radial tuberosity was identified.  Retractors were placed with care under the radial neck in order to prevent injury to the posterior interosseous nerve.  At this time, a trough was created in radial tuberosity, three drill holes were placed and the tendon was then passed, and then a fiber loop suture was passed into the tendon, which was then passed into the trough and tied over three drill holes.  Secure repair was achieved without undue tension.  Tourniquet was released.  Bleeding points encountered and controlled with bipolar electrocautery.  Thorough irrigation was performed.  The proximal incision and the antecubital fossa was closed using 2-0 Vicryl and 3-0 nylon.  Distal incision was closed using interrupted inverted 2- 0 Vicryl suture and 3-0 Prolene.  Skip Mayer assistance was required at all times during the case for retraction of important neurovascular structures and limb positioning, opening and closing.  Her assistance was a medical necessity.   Burnard Bunting, M.D.     GSD/MEDQ  D:  03/20/2011  T:  03/20/2011  Job:  [id number]  Electronically Signed by Reece Agar.  DEAN M.D. on 04/12/2011 03:05:20 PM

## 2011-04-24 NOTE — Discharge Summary (Signed)
  [name], Mike James NO.:  [id number]  MEDICAL RECORD NO.:  [id number]  LOCATION:  3015                         FACILITY:  [facility]  PHYSICIAN:  Burnard Bunting, M.D.    DATE OF BIRTH:  January 20, 1946  DATE OF ADMISSION:  03/20/2011 DATE OF DISCHARGE:  03/21/2011                              DISCHARGE SUMMARY   DISCHARGE DIAGNOSIS:  Right chronic distal biceps rupture repair performed March 20, 2011.  HOSPITAL COURSE:  Mike James is a patient with right chronic distal biceps rupture which underwent repair at March 20, 2011, tolerated procedure well without immediate complication.  The hand was intact.  He was in a well-padded splint.  He had unremarkable recovery.  Pain was controlled on oral pain medicine at the time of discharge.  DISCHARGE MEDICATIONS: 1. Allopurinol 100 mg p.o. daily. 2. Multivitamins 1 p.o. daily. 3. Percocet 1-2 p.o. q.3-4 h. p.r.n. pain of the 5/325. 4. Robaxin 500 mg p.o. q.8 h. p.r.n. spasms.  He will follow up with me in 4 days.  He is discharged in good condition.     Burnard Bunting, M.D.     GSD/MEDQ  D:  04/12/2011  T:  04/12/2011  Job:  [id number]  Electronically Signed by Reece Agar.  Penina Reisner M.D. on 04/24/2011 05:08:18 PM

## 2011-08-14 DIAGNOSIS — N529 Male erectile dysfunction, unspecified: Secondary | ICD-10-CM | POA: Insufficient documentation

## 2011-08-14 DIAGNOSIS — R972 Elevated prostate specific antigen [PSA]: Secondary | ICD-10-CM | POA: Insufficient documentation

## 2014-01-30 ENCOUNTER — Ambulatory Visit (INDEPENDENT_AMBULATORY_CARE_PROVIDER_SITE_OTHER): Payer: Medicare Other | Admitting: Neurology

## 2014-01-30 ENCOUNTER — Encounter: Payer: Self-pay | Admitting: Neurology

## 2014-01-30 VITALS — BP 128/78 | HR 84 | Ht 69.5 in | Wt 207.0 lb

## 2014-01-30 DIAGNOSIS — R42 Dizziness and giddiness: Secondary | ICD-10-CM

## 2014-01-30 DIAGNOSIS — G459 Transient cerebral ischemic attack, unspecified: Secondary | ICD-10-CM

## 2014-01-30 DIAGNOSIS — G450 Vertebro-basilar artery syndrome: Secondary | ICD-10-CM

## 2014-01-30 NOTE — Progress Notes (Signed)
[facility]    Provider:  Dr Janann Colonel Referring Provider: Thea Silversmith, Dianna Rossetti, MD Primary Care Physician:  Thurman Coyer, MD  CC:  vertigo  HPI:  Mike James is a 68 y.o. male here as a referral from Dr. Thea Silversmith for vertigo evaluation  Symptoms started around 1 year ago. 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 Symptoms have continued though, now are triggered by extending his neck/head back. When he tilts his head back he starts to see stars and then will start to have graying out of his vision. This improves after a few minutes when he leans forward. During these episodes he also gets the vertigo sensation. He reports having a MRI of the brain ( he thinks it was with contrast) that he reports was unremarkable. No focal weakness or sensory changes during these episodes. He denies any history of HTN, DM, HLD.   Has history of prostate cancer, has been cancer free for around 6 years.    Review of Systems: Out of a complete 14 system review, the patient complains of only the following symptoms, and all other reviewed systems are negative. + blurred vision, dizziness, passing out, fatigue  History   Social History  . Marital Status: Married    Spouse Name: Dannelle     Number of Children: 3  . Years of Education: 12+   Occupational History  . Retired     Social History Main Topics  . Smoking status: Former Research scientist (life sciences)  . Smokeless tobacco: Never Used  . Alcohol Use: Yes  . Drug Use: No  . Sexual Activity: Not on file   Other Topics Concern  . Not on file   Social History Narrative   Patient lives at home with wife Dannelle.    Patient has 3 children.    Patient is retired.    Patient has an Associates Degree.    Patient is right handed.     Family History  Problem Relation Age of Onset  .       Past Medical History  Diagnosis Date  . Dizziness   . Malignant neoplasm of prostate      History reviewed. No pertinent past surgical history.  Current Outpatient Prescriptions  Medication Sig Dispense Refill  . allopurinol (ZYLOPRIM) 300 MG tablet TAKE ONE TABLET BY MOUTH ONCE DAILY      . allopurinol (ZYLOPRIM) 300 MG tablet       . Beta Sitosterol 40 % POWD by Does not apply route.      . Cholecalciferol (VITAMIN D3) 3000 UNITS TABS Take 400 Int'l Units/day by mouth.      . dutasteride (AVODART) 0.5 MG capsule Take 0.5 mg by mouth.      Marland Kitchen ibuprofen (ADVIL,MOTRIN) 200 MG tablet Take 200 mg by mouth as needed.      . Lecithin Concentrate 400 MG CAPS Take 411 mg by mouth.      Marland Kitchen lisinopril (PRINIVIL,ZESTRIL) 10 MG tablet       . Mag Aspart-Potassium Aspart (POTASSIUM & MAGNESIUM ASPARTAT) 250-250 MG CAPS Take 100 mg by mouth.      . Melatonin 3 MG TABS Take 3 mg by mouth.      . MULTIPLE MINERALS PO Take by mouth.      . Multiple Vitamin (MULTIVITAMIN) tablet Take 1 tablet by mouth.      . Omega-3 Fatty Acids (FISH OIL) 1000 MG CAPS Take 1 capsule by  mouth.      . Pantothenic Acid 500 MG TABS Take by mouth.      . Pyridoxine HCl (VITAMIN B-6) 250 MG tablet Take 250 mg by mouth.      . tamsulosin (FLOMAX) 0.4 MG CAPS capsule       . vitamin E 200 UNIT capsule Take 200 Units by mouth.      Marland Kitchen ZOSTAVAX 38756 UNT/0.65ML injection        No current facility-administered medications for this visit.    Allergies as of 01/30/2014 - Review Complete 01/30/2014  Allergen Reaction Noted  . Cheese Swelling 01/30/2014    Vitals: BP 128/78  Pulse 84  Ht 5' 9.5" (1.765 m)  Wt 207 lb (93.895 kg)  BMI 30.14 kg/m2 Last Weight:  Wt Readings from Last 1 Encounters:  01/30/14 207 lb (93.895 kg)   Last Height:   Ht Readings from Last 1 Encounters:  01/30/14 5' 9.5" (1.765 m)     Physical exam: Exam: Gen: NAD, conversant Eyes: anicteric sclerae, moist conjunctivae HENT: Atraumatic, oropharynx clear Neck: Trachea midline; supple,  Lungs: CTA, no wheezing, rales, rhonic                           CV: RRR, no MRG Abdomen: Soft, non-tender;  Extremities: No peripheral edema  Skin: Normal temperature, no rash,  Psych: Appropriate affect, pleasant  Neuro: MS: AA&Ox3, appropriately interactive, normal affect   Attention: WORLD backwards  Speech: fluent w/o paraphasic error  Memory: good recent and remote recall  CN: PERRL, EOMI no nystagmus, no ptosis, sensation intact to LT V1-V3 bilat, face symmetric, no weakness, hearing grossly intact, palate elevates symmetrically, shoulder shrug 5/5 bilat,  tongue protrudes midline, no fasiculations noted.  Motor: normal bulk and tone Strength: 5/5  In all extremities  Coord: rapid alternating and point-to-point (FNF, HTS) movements intact.  Reflexes: symmetrical, bilat downgoing toes  Sens: LT intact in all extremities  Gait: wide based, slow, unable to tandem. Wobbles with Romberg but does not fall   Assessment:  After physical and neurologic examination, review of laboratory studies, imaging, neurophysiology testing and pre-existing records, assessment will be reviewed on the problem list.  Plan:  Treatment plan and additional workup will be reviewed under Problem List.  1)Vertigo  68y/o gentleman presenting for initial evaluation of vertigo. Notes symptoms have been ongoing for around 1 year. Initially triggered by movement, was diagnosed with BPPV and treated with Epley maneuver which helped. Recently noticing vertigo and vision changes associated with extending his head back. Question if this represents a VBI. Will check MRA head. Follow up once MRA completed.   Jim Like, DO  [facility] [address] [place], [place] [address]  Phone [phone] Fax [phone]

## 2014-01-30 NOTE — Patient Instructions (Signed)
Overall you are doing fairly well but I do want to suggest a few things today:   Remember to drink plenty of fluid, eat healthy meals and do not skip any meals. Try to eat protein with a every meal and eat a healthy snack such as fruit or nuts in between meals. Try to keep a regular sleep-wake schedule and try to exercise daily, particularly in the form of walking, 20-30 minutes a day, if you can.   As far as diagnostic testing:  1)I would like you to have a MRA of your head. You will be called to schedule this.   We will follow up once the MRA is completed. Please call us with any interim questions, concerns, problems, updates or refill requests.   My clinical assistant and will answer any of your questions and relay your messages to me and also relay most of my messages to you.   Our phone number is [phone]. We also have an after hours call service for urgent matters and there is a physician on-call for urgent questions. For any emergencies you know to call 911 or go to the nearest emergency room

## 2014-02-02 ENCOUNTER — Other Ambulatory Visit: Payer: Self-pay | Admitting: Neurology

## 2014-02-02 ENCOUNTER — Telehealth: Payer: Self-pay | Admitting: Neurology

## 2014-02-02 DIAGNOSIS — I639 Cerebral infarction, unspecified: Secondary | ICD-10-CM

## 2014-02-02 NOTE — Telephone Encounter (Signed)
Returned call. Patient has acute onset of binocular vertical diplopia. Came on suddenly Friday and has persisted. No worsening. No other symptoms. No speech difficulties, no ataxia, no focal motor or sensory changes. Discussed concern that this may represent a small ischemic infarct. Counseled patient to immediately go to ED/call 911 if symptoms worsen or he develops new deficits. Will add MRI brain to pending MRA. Instructed patient to start ASA 52m daily.

## 2014-02-02 NOTE — Telephone Encounter (Signed)
Patient calling to state that when he saw Dr. Janann Colonel on Friday he did not have double vision, but now he is experiencing it and states that it is very annoying. Please return call to patient and advise.

## 2014-02-04 NOTE — Telephone Encounter (Signed)
Patient calling to state that he does not feel that an MRI is necessary since he had one not too long ago and can bring the results. Patient does think that the MRA is necessary but does not wish to have the MRI since it will be wasteful, please return call to patient and advise. [facility] has already contacted him to get these tests scheduled.

## 2014-02-05 NOTE — Telephone Encounter (Signed)
Dr. Janann Colonel recommended MRA head. If patient wants to hold off on scan, then he may decline testing. -VRP

## 2014-02-05 NOTE — Telephone Encounter (Signed)
I spoke to pt and as per below, had MRI at [facility] back in 05/2013.  Brought these and his records to the office this Tuesday.  Went also to the eye doctor, stated from this that fatty tissue not blood problem caused double vision.   Has appt scheduled for MRI/MRA next Monday, but thinks that MRI does not need to be done.  Dr. Janann Colonel out of office, Dr. Amadeo Garnet.

## 2014-02-06 NOTE — Telephone Encounter (Signed)
I called pt and after Dr. Leta Baptist looked at his previous MRI (normal).   I relayed  pt and he will cancel the MRI brain for now.  Will consult with  Dr. Janann Colonel when back on Monday.  Pt verbalized understanding. Previous MRI done 05/2013.  New sx since then may show up on MRI.

## 2014-02-06 NOTE — Telephone Encounter (Signed)
Pt returned call.  Has both scans scheduled for Monday at 1215.  He is asking about needing the MRI since had the one previously.  I placed in Dr. Gladstone Lighter office to see if needed.

## 2014-02-09 ENCOUNTER — Other Ambulatory Visit: Payer: Worker's Compensation

## 2014-02-09 ENCOUNTER — Ambulatory Visit
Admission: RE | Admit: 2014-02-09 | Discharge: 2014-02-09 | Disposition: A | Discharge status: Home / Self Care | Payer: Medicare Other | Source: Ambulatory Visit | Attending: Neurology | Admitting: Neurology

## 2014-02-09 DIAGNOSIS — G450 Vertebro-basilar artery syndrome: Secondary | ICD-10-CM

## 2014-02-09 DIAGNOSIS — G459 Transient cerebral ischemic attack, unspecified: Secondary | ICD-10-CM

## 2014-02-09 NOTE — Telephone Encounter (Signed)
I called pt and he is having imaging now (MRA).  I told him that would like to have eye exam results faxed to [place], and he wanted appt set up with Dr. Janann Colonel.  He will call after his imaging is done and make appt.

## 2014-02-09 NOTE — Telephone Encounter (Signed)
Pt called back and is asking about f/u appt.  Made appt for 02-11-14 at 1430.  Dr. Harlin Rain, [place] [phone], [phone].  Request for eye exam made today.

## 2014-02-09 NOTE — Telephone Encounter (Signed)
That is fine, we can hold off on the MRI. Can you ask him to please have his eye doctor fax a report over to [place]. Thanks.

## 2014-02-11 ENCOUNTER — Encounter: Payer: Self-pay | Admitting: Neurology

## 2014-02-11 ENCOUNTER — Ambulatory Visit (INDEPENDENT_AMBULATORY_CARE_PROVIDER_SITE_OTHER): Payer: Medicare Other | Admitting: Neurology

## 2014-02-11 VITALS — BP 129/86 | HR 109 | Ht 69.5 in | Wt 207.0 lb

## 2014-02-11 DIAGNOSIS — G452 Multiple and bilateral precerebral artery syndromes: Secondary | ICD-10-CM

## 2014-02-11 DIAGNOSIS — I658 Occlusion and stenosis of other precerebral arteries: Secondary | ICD-10-CM

## 2014-02-11 NOTE — Patient Instructions (Signed)
Overall you are doing fairly well but I do want to suggest a few things today:   Remember to drink plenty of fluid, eat healthy meals and do not skip any meals. Try to eat protein with a every meal and eat a healthy snack such as fruit or nuts in between meals. Try to keep a regular sleep-wake schedule and try to exercise daily, particularly in the form of walking, 20-30 minutes a day, if you can.   I would like you to have a carotid ultrasound. Please schedule this when you check out.   Please call us with any interim questions, concerns, problems, updates or refill requests.   My clinical assistant and will answer any of your questions and relay your messages to me and also relay most of my messages to you.   Our phone number is [phone]. We also have an after hours call service for urgent matters and there is a physician on-call for urgent questions. For any emergencies you know to call 911 or go to the nearest emergency room

## 2014-02-11 NOTE — Progress Notes (Signed)
[facility]    Provider:  Dr Janann Colonel Referring Provider: Thea Silversmith, Dianna Rossetti, MD Primary Care Physician:  Thurman Coyer, MD  CC:  vertigo  HPI:  Mike James is a 68 y.o. male here as a follow up from Dr. Thea Silversmith for vertigo evaluation. Since last visit he has had a MRA of the head. He recently developed vertical diplopia and saw his optometrist, his exam was overall unremarkable. MRA was reviewed and was unremarkable. Notes severe episode last week were he had a sensation of feeling heavy and tipping to the left. No difficulty speaking. No confusion. Improved with sitting and not moving. Not triggered by moving his head a certain direction. Lasted all day, woke up next day and felt fine. He notes that his worst days are tied to weather changes. Does note a history of sinus congestion. No headaches associated with these symptoms.   Initial visit 12/2013: Symptoms started around 1 year ago. 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 Symptoms have continued though, now are triggered by extending his neck/head back. When he tilts his head back he starts to see stars and then will start to have graying out of his vision. This improves after a few minutes when he leans forward. During these episodes he also gets the vertigo sensation. He reports having a MRI of the brain ( he thinks it was with contrast) that he reports was unremarkable. No focal weakness or sensory changes during these episodes. He denies any history of HTN, DM, HLD.   Has history of prostate cancer, has been cancer free for around 6 years.    Review of Systems: Out of a complete 14 system review, the patient complains of only the following symptoms, and all other reviewed systems are negative. + blurred vision, dizziness, passing out, fatigue  History   Social History  . Marital Status: Married    Spouse Name: Dannelle     Number of Children: 3    . Years of Education: 12+   Occupational History  . Retired     Social History Main Topics  . Smoking status: Former Research scientist (life sciences)  . Smokeless tobacco: Never Used  . Alcohol Use: Yes  . Drug Use: No  . Sexual Activity: Not on file   Other Topics Concern  . Not on file   Social History Narrative   Patient lives at home with wife Dannelle.    Patient has 3 children.    Patient is retired.    Patient has an Associates Degree.    Patient is right handed.     Family History  Problem Relation Age of Onset  .       Past Medical History  Diagnosis Date  . Dizziness   . Malignant neoplasm of prostate     No past surgical history on file.  Current Outpatient Prescriptions  Medication Sig Dispense Refill  . allopurinol (ZYLOPRIM) 300 MG tablet       . Beta Sitosterol 40 % POWD by Does not apply route.      . cholecalciferol (VITAMIN D) 400 UNITS TABS tablet Take 400 Units by mouth.      . dutasteride (AVODART) 0.5 MG capsule Take 0.5 mg by mouth.      Marland Kitchen ibuprofen (ADVIL,MOTRIN) 200 MG tablet Take 200 mg by mouth as needed.      . Lecithin Concentrate 400 MG CAPS Take 400 mg by mouth.       Marland Kitchen  lisinopril (PRINIVIL,ZESTRIL) 10 MG tablet       . Mag Aspart-Potassium Aspart (POTASSIUM & MAGNESIUM ASPARTAT) 250-250 MG CAPS Take 100 mg by mouth.      . Melatonin 3 MG TABS Take 3 mg by mouth.      . MULTIPLE MINERALS PO Take by mouth.      . Multiple Vitamin (MULTIVITAMIN) tablet Take 1 tablet by mouth.      . Omega-3 Fatty Acids (FISH OIL) 1000 MG CAPS Take 1 capsule by mouth.      . Pantothenic Acid 100 MG TABS Take 100 mg by mouth 2 (two) times daily.      . Pyridoxine HCl (VITAMIN B-6) 250 MG tablet Take 250 mg by mouth.      . tamsulosin (FLOMAX) 0.4 MG CAPS capsule       . vitamin E 200 UNIT capsule Take 200 Units by mouth.      Marland Kitchen ZOSTAVAX 16109 UNT/0.65ML injection        No current facility-administered medications for this visit.    Allergies as of 02/11/2014 - Review  Complete 02/11/2014  Allergen Reaction Noted  . Cheese Swelling 01/30/2014    Vitals: There were no vitals taken for this visit. Last Weight:  Wt Readings from Last 1 Encounters:  01/30/14 207 lb (93.895 kg)   Last Height:   Ht Readings from Last 1 Encounters:  01/30/14 5' 9.5" (1.765 m)     Physical exam: Exam: Gen: NAD, conversant Eyes: anicteric sclerae, moist conjunctivae HENT: Atraumatic, oropharynx clear Neck: Trachea midline; supple,  Lungs: CTA, no wheezing, rales, rhonic                          CV: RRR, no MRG Abdomen: Soft, non-tender;  Extremities: No peripheral edema  Skin: Normal temperature, no rash,  Psych: Appropriate affect, pleasant  Neuro: MS: AA&Ox3, appropriately interactive, normal affect   Attention: WORLD backwards  Speech: fluent w/o paraphasic error  Memory: good recent and remote recall  CN: PERRL, EOMI no nystagmus, no ptosis, sensation intact to LT V1-V3 bilat, face symmetric, no weakness, hearing grossly intact, palate elevates symmetrically, shoulder shrug 5/5 bilat,  tongue protrudes midline, no fasiculations noted.  Motor: normal bulk and tone Strength: 5/5  In all extremities  Coord: rapid alternating and point-to-point (FNF, HTS) movements intact.  Reflexes: symmetrical, bilat downgoing toes  Sens: LT intact in all extremities  Gait: wide based, slow, unable to tandem. Wobbles with Romberg but does not fall   Assessment:  After physical and neurologic examination, review of laboratory studies, imaging, neurophysiology testing and pre-existing records, assessment will be reviewed on the problem list.  Plan:  Treatment plan and additional workup will be reviewed under Problem List.  1)Vertigo  68y/o gentleman presenting for follow up evaluation of vertigo. Notes symptoms have been ongoing for around 1 year. Initially triggered by movement, was diagnosed with BPPV and treated with Epley maneuver which helped. Recently  noticing vertigo and vision changes associated with extending his head back. Since last visit has had new onset vertical diplopia.  Most recent episode was not associated with extension of neck and lasted multiple hours. Unclear etiology. MRI and MRA of brain both unremarkable making a vascular or structural process less likely. Will check carotid doppler to rule out stenosis. Symptoms triggered by change in weather raise question of sinus related condition. May need ENT follow up. Will follow up with patient once carotid doppler completed.  Jim Like, DO  [facility] [address] [place], [place] [address]  Phone [phone] Fax [phone]

## 2014-02-19 ENCOUNTER — Ambulatory Visit (INDEPENDENT_AMBULATORY_CARE_PROVIDER_SITE_OTHER): Payer: Medicare Other

## 2014-02-19 DIAGNOSIS — G452 Multiple and bilateral precerebral artery syndromes: Secondary | ICD-10-CM

## 2014-02-19 DIAGNOSIS — I658 Occlusion and stenosis of other precerebral arteries: Secondary | ICD-10-CM

## 2014-02-26 ENCOUNTER — Other Ambulatory Visit: Payer: Self-pay | Admitting: Neurology

## 2014-02-26 ENCOUNTER — Telehealth: Payer: Self-pay | Admitting: Neurology

## 2014-02-26 DIAGNOSIS — R42 Dizziness and giddiness: Secondary | ICD-10-CM

## 2014-02-26 NOTE — Telephone Encounter (Signed)
Please let him know it was normal. I would like him to follow up with ENT as we had discussed for further evaluation. Thanks.

## 2014-02-26 NOTE — Telephone Encounter (Signed)
Patient requesting results of recent testing. Best number to call [phone] and it is okay to leave a message

## 2014-02-26 NOTE — Telephone Encounter (Signed)
Patient wanting results of her Carotid study. Please advise.

## 2014-02-27 NOTE — Telephone Encounter (Signed)
Spoke to patient and he is aware of his results and of the referral being sent to [facility]. Patient appt with them is Sept 1st at 1:30pm. Patient verbalizes understanding.

## 2014-06-08 DIAGNOSIS — N398 Other specified disorders of urinary system: Secondary | ICD-10-CM | POA: Insufficient documentation

## 2015-08-11 DIAGNOSIS — H40053 Ocular hypertension, bilateral: Secondary | ICD-10-CM | POA: Insufficient documentation

## 2018-05-20 DIAGNOSIS — E785 Hyperlipidemia, unspecified: Secondary | ICD-10-CM | POA: Insufficient documentation

## 2018-05-20 DIAGNOSIS — E119 Type 2 diabetes mellitus without complications: Secondary | ICD-10-CM | POA: Insufficient documentation

## 2018-05-20 DIAGNOSIS — M25562 Pain in left knee: Secondary | ICD-10-CM | POA: Insufficient documentation

## 2018-05-20 DIAGNOSIS — E1169 Type 2 diabetes mellitus with other specified complication: Secondary | ICD-10-CM | POA: Insufficient documentation

## 2018-05-20 DIAGNOSIS — E669 Obesity, unspecified: Secondary | ICD-10-CM | POA: Insufficient documentation

## 2018-05-20 DIAGNOSIS — G8929 Other chronic pain: Secondary | ICD-10-CM | POA: Insufficient documentation

## 2018-05-20 DIAGNOSIS — S46209A Unspecified injury of muscle, fascia and tendon of other parts of biceps, unspecified arm, initial encounter: Secondary | ICD-10-CM | POA: Insufficient documentation

## 2021-04-11 NOTE — Progress Notes (Signed)
GU Location of Tumor / Histology: Prostate Ca  If Prostate Cancer, Gleason Score is (5 + 4) and PSA is (5.160 as of 9/22)  Biopsies  Dr. Sherral Hammers  Dr. Curlene Dolphin    Past/Anticipated interventions by urology, if any:   Past/Anticipated interventions by medical oncology, if any:   Weight changes, if any: No, stable.  SHIM:  5 IPSS:  23  Bowel/Bladder complaints, if any:  No bowel and yes for bladder BPH, urgency but unable to go.  Nausea/Vomiting, if any: No  Pain issues, if any:  4/10 pain scale for knees.  SAFETY ISSUES: Prior radiation?  No Pacemaker/ICD? No Possible current pregnancy?  Male Is the patient on methotrexate? No  Current Complaints / other details:  Occasional hot flash.  Need more information on treatment.

## 2021-04-12 ENCOUNTER — Encounter: Payer: Self-pay | Admitting: Radiation Oncology

## 2021-04-12 ENCOUNTER — Ambulatory Visit
Admission: RE | Admit: 2021-04-12 | Discharge: 2021-04-12 | Disposition: A | Discharge status: Home / Self Care | Payer: Medicare HMO | Source: Ambulatory Visit | Attending: Radiation Oncology | Admitting: Radiation Oncology

## 2021-04-12 ENCOUNTER — Other Ambulatory Visit: Payer: Self-pay

## 2021-04-12 VITALS — BP 143/75 | HR 75 | Temp 97.7°F | Resp 18 | Ht 66.5 in | Wt 204.6 lb

## 2021-04-12 DIAGNOSIS — C61 Malignant neoplasm of prostate: Secondary | ICD-10-CM | POA: Diagnosis present

## 2021-04-12 DIAGNOSIS — M109 Gout, unspecified: Secondary | ICD-10-CM | POA: Insufficient documentation

## 2021-04-12 DIAGNOSIS — Z01818 Encounter for other preprocedural examination: Secondary | ICD-10-CM | POA: Insufficient documentation

## 2021-04-12 DIAGNOSIS — Z860100 Personal history of colon polyps, unspecified: Secondary | ICD-10-CM | POA: Insufficient documentation

## 2021-04-12 DIAGNOSIS — N401 Enlarged prostate with lower urinary tract symptoms: Secondary | ICD-10-CM | POA: Insufficient documentation

## 2021-04-12 DIAGNOSIS — Z9852 Vasectomy status: Secondary | ICD-10-CM | POA: Insufficient documentation

## 2021-04-12 DIAGNOSIS — R9721 Rising PSA following treatment for malignant neoplasm of prostate: Secondary | ICD-10-CM | POA: Diagnosis not present

## 2021-04-12 DIAGNOSIS — Z7984 Long term (current) use of oral hypoglycemic drugs: Secondary | ICD-10-CM | POA: Diagnosis not present

## 2021-04-12 DIAGNOSIS — H43819 Vitreous degeneration, unspecified eye: Secondary | ICD-10-CM | POA: Insufficient documentation

## 2021-04-12 DIAGNOSIS — M961 Postlaminectomy syndrome, not elsewhere classified: Secondary | ICD-10-CM | POA: Insufficient documentation

## 2021-04-12 DIAGNOSIS — N400 Enlarged prostate without lower urinary tract symptoms: Secondary | ICD-10-CM | POA: Insufficient documentation

## 2021-04-12 DIAGNOSIS — I10 Essential (primary) hypertension: Secondary | ICD-10-CM | POA: Insufficient documentation

## 2021-04-12 DIAGNOSIS — E119 Type 2 diabetes mellitus without complications: Secondary | ICD-10-CM | POA: Insufficient documentation

## 2021-04-12 DIAGNOSIS — Z79899 Other long term (current) drug therapy: Secondary | ICD-10-CM | POA: Diagnosis not present

## 2021-04-12 DIAGNOSIS — N138 Other obstructive and reflux uropathy: Secondary | ICD-10-CM | POA: Insufficient documentation

## 2021-04-12 DIAGNOSIS — Z961 Presence of intraocular lens: Secondary | ICD-10-CM | POA: Insufficient documentation

## 2021-04-12 DIAGNOSIS — K8020 Calculus of gallbladder without cholecystitis without obstruction: Secondary | ICD-10-CM | POA: Insufficient documentation

## 2021-04-12 DIAGNOSIS — S46219A Strain of muscle, fascia and tendon of other parts of biceps, unspecified arm, initial encounter: Secondary | ICD-10-CM | POA: Insufficient documentation

## 2021-04-12 NOTE — Progress Notes (Signed)
Radiation Oncology         (336) [phone] ________________________________  Initial Outpatient Consultation  Name: Mike James MRN: [id number]  Date: 04/12/2021  DOB: 02-19-1946  [id number], Mike Rossetti, MD  Mike Hock, MD   REFERRING PHYSICIAN: Sherrlyn Hock, MD  DIAGNOSIS: 75 y.o. 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    ICD-10-CM   1. Malignant neoplasm of prostate ([place])  C61       HISTORY OF PRESENT ILLNESS: Mike James is a 75 y.o. male with a diagnosis of prostate cancer. He was initially diagnosed with low volume Gleason 3+3 (1 core with 5% malignancy) prostate cancer in 2008 under the care of Dr. Tresa James. He had since been followed by the [facility], as well as with Dr. Rosana James at [facility] and has been on Avodart and Flomax for a number of years for management of his BPH with BOO. He also underwent TURP with Dr. Rosana James on 06/16/09.  Unfortunately, his PSA has been rising despite the Avodart and got up to 5.93 (~11.8 adjusted for Avodart) in 10/2020. Of note, he was switched to finasteride in 10/2020. The rising PSA prompted a prostate MRI which was performed on 01/17/21, and showed an abnormal, 2.8 cm PI-RADS 5 lesion centered within the posterior medial right peripheral zone at the apex with invasion into the central gland, and extracapsular extension into the posterior medial right peripheral zone at the apex.there was suspicion of involvement of the right neurovascular bundle but no evidence of pelvic lymphadenopathy.   The patient proceeded to MRI fusion biopsy of the prostate on 03/09/21. On digital rectal examination performed at the time of biopsy, there was a large, firm, fixated right apical nodule, corresponding to the lesion seen on MRI. The prostate volume measured 132 cc by ultrasound.  Out of 16 core biopsies, 7 were positive.  The maximum Gleason score was 5+4, and this was  seen in two right-sided cores (with perineural invasion), three left-sided cores, and two of the four cores sampled from the ROI MRI lesion (with PNI).  He underwent PSMA scan on 03/22/21 for disease staging and this showed focal prominent uptake in the right paramidline posteroinferior aspect of the prostate gland extending posteriorly to contact the anterior rectal wall consistent with history of prostate cancer but no worrisome lymph node uptake or suspicious osseous lesions. His most recent PSA from 03/24/21 was stable at 5.16 (10.4 adjusted for finasteride).  He met with Dr. Lissa James and Dr. Carlean James to discuss his treatment options.  He was not felt to be an optimal candidate for brachytherapy boost with his large prostate volume as well as prior TURP and evidence of extracapsular extension on recent prostate MRI.  He was also not felt to be an ideal surgical candidate given his advanced age and medical comorbidities.  Therefore, the recommendation was for LT-ADT concurrent with an 8-week course of prostate IMRT.  He was started on ADT with Eligard on 03/25/21.  He lives in [place] and thus preferred to have his daily radiation treatments closer to home.  He has kindly been referred today for discussion of potential radiation treatment options locally in [place].   PREVIOUS RADIATION THERAPY: No  PAST MEDICAL HISTORY:  Past Medical History:  Diagnosis Date   Dizziness    Malignant neoplasm of prostate ([place])       PAST SURGICAL HISTORY:No past surgical history on  file.  FAMILY HISTORY:  Family History  Problem Relation Age of Onset    () Other     SOCIAL HISTORY:  Social History   Socioeconomic History   Marital status: Married    Spouse name: Sellers    Number of children: 3   Years of education: 12+   Highest education level: Not on file  Occupational History   Occupation: Retired   Tobacco Use   Smoking status: Former   Smokeless tobacco: Never  Substance  and Sexual Activity   Alcohol use: Yes   Drug use: No   Sexual activity: Not on file  Other Topics Concern   Not on file  Social History Narrative   Patient lives at home with wife Mike James.    Patient has 3 children.    Patient is retired.    Patient has an Associates Degree.    Patient is right handed.    Social Determinants of Health   Financial Resource Strain: Not on file  Food Insecurity: Not on file  Transportation Needs: Not on file  Physical Activity: Not on file  Stress: Not on file  Social Connections: Not on file  Intimate Partner Violence: Not on file    ALLERGIES: Cheese  MEDICATIONS:  Current Outpatient Medications  Medication Sig Dispense Refill   allopurinol (ZYLOPRIM) 300 MG tablet Take 1 tablet by mouth daily.     fenofibrate 160 MG tablet Take 1 tablet by mouth daily.     fenofibrate 160 MG tablet Take by mouth.     finasteride (PROSCAR) 5 MG tablet Take 1 tablet by mouth daily.     latanoprost (XALATAN) 0.005 % ophthalmic solution INSTILL 1 DROP IN RIGHT EYE AT BEDTIME     losartan (COZAAR) 50 MG tablet      metFORMIN (GLUCOPHAGE-XR) 500 MG 24 hr tablet Take 1 tablet by mouth 2 (two) times daily.     tamsulosin (FLOMAX) 0.4 MG CAPS capsule Take 1 capsule by mouth 2 (two) times daily.     allopurinol (ZYLOPRIM) 300 MG tablet      Beta Sitosterol 40 % POWD by Does not apply route.     cholecalciferol (VITAMIN D) 400 UNITS TABS tablet Take 400 Units by mouth.     Cholecalciferol 75 MCG (3000 UT) TABS Take by mouth.     dutasteride (AVODART) 0.5 MG capsule Take 0.5 mg by mouth.     ibuprofen (ADVIL) 200 MG tablet Take by mouth.     ibuprofen (ADVIL,MOTRIN) 200 MG tablet Take 200 mg by mouth as needed.     Lecithin Concentrate 400 MG CAPS Take 400 mg by mouth.      lisinopril (PRINIVIL,ZESTRIL) 10 MG tablet Take 10 mg by mouth daily.      Mag Aspart-Potassium Aspart (POTASSIUM & MAGNESIUM ASPARTAT) 250-250 MG CAPS Take 100 mg by mouth.     melatonin 3 MG  TABS tablet Take by mouth.     Melatonin 3 MG TABS Take 3 mg by mouth.     MULTIPLE MINERALS PO Take by mouth.     Multiple Vitamin (MULTIVITAMIN) tablet Take 1 tablet by mouth.     Omega-3 Fatty Acids (FISH OIL) 1000 MG CAPS Take 1 capsule by mouth.     Pantothenic Acid 100 MG TABS Take 100 mg by mouth 2 (two) times daily.     Pyridoxine HCl (VITAMIN B-6) 250 MG tablet Take 250 mg by mouth.     tamsulosin (FLOMAX) 0.4 MG CAPS capsule  vitamin E 200 UNIT capsule Take 200 Units by mouth.     vitamin E 200 UNIT capsule Take by mouth.     ZOSTAVAX 50354 UNT/0.65ML injection      No current facility-administered medications for this encounter.    REVIEW OF SYSTEMS:  On review of systems, the patient reports that he is doing well overall. He denies any chest pain, shortness of breath, cough, fevers, chills, night sweats, unintended weight changes. He denies any bowel disturbances, and denies abdominal pain, nausea or vomiting. He denies any new musculoskeletal or joint aches or pains. His IPSS was 23, indicating severe urinary symptoms despite the Flomax and finasteride daily. 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 He also notes occasional hot flashes from the [place]. A complete review of systems is obtained and is otherwise negative.   PHYSICAL EXAM:  Wt Readings from Last 3 Encounters:  04/12/21 204 lb 9.6 oz (92.8 kg)  02/11/14 207 lb (93.9 kg)  01/30/14 207 lb (93.9 kg)   Temp Readings from Last 3 Encounters:  04/12/21 97.7 F (36.5 C) (Temporal)   BP Readings from Last 3 Encounters:  04/12/21 (!) 143/75  02/11/14 129/86  01/30/14 128/78   Pulse Readings from Last 3 Encounters:  04/12/21 75  02/11/14 (!) 109  01/30/14 84   Pain Assessment Pain Score: 3  Pain Loc: Knee (Both with ambulation pain can increase)/10  In general this is a well appearing Caucasian male in no acute distress. He's alert and oriented x4 and appropriate throughout the  examination. Cardiopulmonary assessment is negative for acute distress, and he exhibits normal effort.     KPS = 100  100 - Normal; no complaints; no evidence of disease. 90   - Able to carry on normal activity; minor signs or symptoms of disease. 80   - Normal activity with effort; some signs or symptoms of disease. 54   - Cares for self; unable to carry on normal activity or to do active work. 60   - Requires occasional assistance, but is able to care for most of his personal needs. 50   - Requires considerable assistance and frequent medical care. 41   - Disabled; requires special care and assistance. 72   - Severely disabled; hospital admission is indicated although death not imminent. 37   - Very sick; hospital admission necessary; active supportive treatment necessary. 10   - Moribund; fatal processes progressing rapidly. 0     - Dead  Karnofsky DA, Abelmann Milton, Craver LS and Burchenal [facility] [phone]) The use of the nitrogen mustards in the palliative treatment of carcinoma: with particular reference to bronchogenic carcinoma Cancer 1 634-56  LABORATORY DATA:  Lab Results  Component Value Date   WBC 6.5 03/20/2011   HGB 15.2 03/20/2011   HCT 43.5 03/20/2011   MCV 92.2 03/20/2011   PLT 203 03/20/2011   Lab Results  Component Value Date   NA 137 03/20/2011   K 4.7 03/20/2011   CL 103 03/20/2011   CO2 17 (L) 03/20/2011   Lab Results  Component Value Date   ALT 33 02/01/2009   AST 22 02/01/2009   ALKPHOS 70 02/01/2009   BILITOT 1.3 (H) 02/01/2009     RADIOGRAPHY: No results found.    IMPRESSION/PLAN: 1. 75 y.o. 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 We discussed the patient's workup and outlined the nature of prostate cancer  in this setting. The patient's T stage, Gleason's score, and PSA put him into the high risk group. Accordingly, he is eligible for a variety of potential  treatment options including prostatectomy or LT-ADT concurrent with either 8 weeks of external radiation or 5 weeks of external radiation with an upfront brachytherapy. We agree that he is not an optimal candidate for brachytherapy boost with his large prostate volume as well as prior TURP and evidence of extracapsular extension on recent prostate MRI.  He is also not an ideal surgical candidate given his advanced age and medical comorbidities.  Therefore, the recommendation is for LT-ADT concurrent with an 8-week course of prostate IMRT. He has already started on ADT with Eligard on 03/25/21.We discussed the available radiation techniques, and focused on the details and logistics of delivery.  We discussed and outlined the risks, benefits, short and long-term effects associated with radiotherapy and compared and contrasted these with prostatectomy. We discussed the role of SpaceOAR gel in reducing the rectal toxicity associated with radiotherapy but that this is not recommended in his case due to his high risk prostate cancer with likely extraprostatic disease.  He was encouraged to ask questions that were answered to his stated satisfaction.  At the conclusion of our conversation, the patient is interested in moving forward with 8 weeks of external beam therapy concurrent with ADT. He has already received his first Eligard injection on 03/25/21. We will share our discussion with Dr. Laurance Flatten and make arrangements for fiducial markers if this can be accomplished in the next 6 weeks, prior to simulation, in anticipation of beginning the daily IMRT in early December 2022, approximately 2 months after starting ADT. The patient appears to have a good understanding of his disease and our treatment recommendations which are of curative intent and is in agreement with the stated plan.  Therefore, we will move forward with treatment planning accordingly.  We personally spent 70 minutes in this encounter including chart  review, reviewing radiological studies, meeting face-to-face with the patient, entering orders and completing documentation.    Nicholos Johns, PA-C    Tyler Pita, MD  [facility] Direct Dial: [phone]  Fax: [phone] [email or web address]  Skype  [email or web address]   This document serves as a record of services personally performed by Tyler Pita, MD and Freeman Caldron, PA-C. It was created on their behalf by Wilburn Mylar, a trained medical scribe. The creation of this record is based on the scribe's personal observations and the provider's statements to them. This document has been checked and approved by the attending provider.

## 2021-04-14 NOTE — Progress Notes (Signed)
Called patient and introduced myself as the prostate nurse navigator and discussed my role.  No barriers to care identified at this time.  He saw Dr. Tammi Klippel on 04/12/2021 and discussed his radiation treatment options.  I notified patient that our medical secretary was coordinating with the [facility] regarding placements of fiducial markers and SpaceOAR gel.  Contact number provided.  Verbalized understanding and appreciation.

## 2021-04-22 ENCOUNTER — Telehealth: Payer: Self-pay | Admitting: *Deleted

## 2021-04-22 NOTE — Telephone Encounter (Signed)
XXXX 

## 2021-05-19 ENCOUNTER — Encounter: Payer: Self-pay | Admitting: Urology

## 2021-05-24 ENCOUNTER — Telehealth: Payer: Self-pay | Admitting: *Deleted

## 2021-05-24 NOTE — Telephone Encounter (Signed)
CALLED PATIENT TO REMIND OF SIM APPT. FOR 05-25-21- ARRIVAL TIME- 12:45 PM @ [place], SPOKE WITH PATIENT AND HE IS AWARE OF THIS APPT.

## 2021-05-25 ENCOUNTER — Ambulatory Visit
Admission: RE | Admit: 2021-05-25 | Discharge: 2021-05-25 | Disposition: A | Discharge status: Home / Self Care | Payer: Medicare HMO | Source: Ambulatory Visit | Attending: Radiation Oncology | Admitting: Radiation Oncology

## 2021-05-25 ENCOUNTER — Other Ambulatory Visit: Payer: Self-pay

## 2021-05-25 DIAGNOSIS — C61 Malignant neoplasm of prostate: Secondary | ICD-10-CM | POA: Diagnosis present

## 2021-05-25 DIAGNOSIS — Z510 Encounter for antineoplastic radiation therapy: Secondary | ICD-10-CM | POA: Diagnosis not present

## 2021-05-25 NOTE — Progress Notes (Signed)
Fiducial markers placed by Dr. Alinda Money 05/19/21, no SpaceOAR due to high risk prostate cancer with likely extraprostatic disease. CT [facility] 05/25/21.  Nicholos Johns, MMS, PA-C [place] [place]: [phone]  Fax: [phone]

## 2021-05-25 NOTE — Progress Notes (Signed)
  Radiation Oncology         (336) [phone] ________________________________  Name: Mike James MRN: [id number]  Date: 05/25/2021  DOB: Aug 08, 1945  SIMULATION AND TREATMENT PLANNING NOTE    ICD-10-CM   1. Malignant neoplasm of prostate ([place])  C61       DIAGNOSIS:  75 y.o. 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  NARRATIVE:  The patient was brought to the [place] [facility].  Identity was confirmed.  All relevant records and images related to the planned course of therapy were reviewed.  The patient freely provided informed written consent to proceed with treatment after reviewing the details related to the planned course of therapy. The consent form was witnessed and verified by the simulation staff.  Then, the patient was set-up in a stable reproducible supine position for radiation therapy.  A vacuum lock pillow device was custom fabricated to position his legs in a reproducible immobilized position.  Then, I performed a urethrogram under sterile conditions to identify the prostatic apex.  CT images were obtained.  Surface markings were placed.  The CT images were loaded into the planning software.  Then the prostate target and avoidance structures including the rectum, bladder, bowel and hips were contoured.  Treatment planning then occurred.  The radiation prescription was entered and confirmed.  A total of one complex treatment devices was fabricated. I have requested : Intensity Modulated Radiotherapy (IMRT) is medically necessary for this case for the following reason:  Rectal sparing.Marland Kitchen  PLAN:  The prostate, seminal vesicles, and pelvic lymph nodes will initially be treated to 45 Gy in 25 fractions of 1.8 Gy followed by a boost to the prostate only, to 75 Gy with 15 additional fractions of 2.0 Gy.  ________________________________  Sheral Apley Tammi Klippel, M.D.

## 2021-06-06 DIAGNOSIS — Z510 Encounter for antineoplastic radiation therapy: Secondary | ICD-10-CM | POA: Insufficient documentation

## 2021-06-06 DIAGNOSIS — C61 Malignant neoplasm of prostate: Secondary | ICD-10-CM | POA: Diagnosis present

## 2021-06-07 ENCOUNTER — Ambulatory Visit
Admission: RE | Admit: 2021-06-07 | Discharge: 2021-06-07 | Disposition: A | Discharge status: Home / Self Care | Payer: No Typology Code available for payment source | Source: Ambulatory Visit | Attending: Radiation Oncology | Admitting: Radiation Oncology

## 2021-06-07 ENCOUNTER — Other Ambulatory Visit: Payer: Self-pay

## 2021-06-07 DIAGNOSIS — Z510 Encounter for antineoplastic radiation therapy: Secondary | ICD-10-CM | POA: Diagnosis not present

## 2021-06-08 ENCOUNTER — Ambulatory Visit
Admission: RE | Admit: 2021-06-08 | Discharge: 2021-06-08 | Disposition: A | Discharge status: Home / Self Care | Payer: No Typology Code available for payment source | Source: Ambulatory Visit | Attending: Radiation Oncology | Admitting: Radiation Oncology

## 2021-06-08 DIAGNOSIS — Z510 Encounter for antineoplastic radiation therapy: Secondary | ICD-10-CM | POA: Diagnosis not present

## 2021-06-09 ENCOUNTER — Other Ambulatory Visit: Payer: Self-pay

## 2021-06-09 ENCOUNTER — Ambulatory Visit
Admission: RE | Admit: 2021-06-09 | Discharge: 2021-06-09 | Disposition: A | Discharge status: Home / Self Care | Payer: No Typology Code available for payment source | Source: Ambulatory Visit | Attending: Radiation Oncology | Admitting: Radiation Oncology

## 2021-06-09 DIAGNOSIS — Z510 Encounter for antineoplastic radiation therapy: Secondary | ICD-10-CM | POA: Diagnosis not present

## 2021-06-10 ENCOUNTER — Ambulatory Visit
Admission: RE | Admit: 2021-06-10 | Discharge: 2021-06-10 | Disposition: A | Discharge status: Home / Self Care | Payer: No Typology Code available for payment source | Source: Ambulatory Visit | Attending: Radiation Oncology | Admitting: Radiation Oncology

## 2021-06-10 DIAGNOSIS — Z510 Encounter for antineoplastic radiation therapy: Secondary | ICD-10-CM | POA: Diagnosis not present

## 2021-06-13 ENCOUNTER — Ambulatory Visit
Admission: RE | Admit: 2021-06-13 | Discharge: 2021-06-13 | Disposition: A | Discharge status: Home / Self Care | Payer: No Typology Code available for payment source | Source: Ambulatory Visit | Attending: Radiation Oncology | Admitting: Radiation Oncology

## 2021-06-13 ENCOUNTER — Other Ambulatory Visit: Payer: Self-pay

## 2021-06-13 DIAGNOSIS — Z510 Encounter for antineoplastic radiation therapy: Secondary | ICD-10-CM | POA: Diagnosis not present

## 2021-06-14 ENCOUNTER — Ambulatory Visit
Admission: RE | Admit: 2021-06-14 | Discharge: 2021-06-14 | Disposition: A | Discharge status: Home / Self Care | Payer: No Typology Code available for payment source | Source: Ambulatory Visit | Attending: Radiation Oncology | Admitting: Radiation Oncology

## 2021-06-14 DIAGNOSIS — Z510 Encounter for antineoplastic radiation therapy: Secondary | ICD-10-CM | POA: Diagnosis not present

## 2021-06-15 ENCOUNTER — Ambulatory Visit
Admission: RE | Admit: 2021-06-15 | Discharge: 2021-06-15 | Disposition: A | Discharge status: Home / Self Care | Payer: No Typology Code available for payment source | Source: Ambulatory Visit | Attending: Radiation Oncology | Admitting: Radiation Oncology

## 2021-06-15 ENCOUNTER — Other Ambulatory Visit: Payer: Self-pay

## 2021-06-15 DIAGNOSIS — Z510 Encounter for antineoplastic radiation therapy: Secondary | ICD-10-CM | POA: Diagnosis not present

## 2021-06-16 ENCOUNTER — Ambulatory Visit
Admission: RE | Admit: 2021-06-16 | Discharge: 2021-06-16 | Disposition: A | Discharge status: Home / Self Care | Payer: No Typology Code available for payment source | Source: Ambulatory Visit | Attending: Radiation Oncology | Admitting: Radiation Oncology

## 2021-06-16 DIAGNOSIS — Z510 Encounter for antineoplastic radiation therapy: Secondary | ICD-10-CM | POA: Diagnosis not present

## 2021-06-17 ENCOUNTER — Ambulatory Visit
Admission: RE | Admit: 2021-06-17 | Discharge: 2021-06-17 | Disposition: A | Discharge status: Home / Self Care | Payer: No Typology Code available for payment source | Source: Ambulatory Visit | Attending: Radiation Oncology | Admitting: Radiation Oncology

## 2021-06-17 ENCOUNTER — Other Ambulatory Visit: Payer: Self-pay

## 2021-06-17 DIAGNOSIS — Z510 Encounter for antineoplastic radiation therapy: Secondary | ICD-10-CM | POA: Diagnosis not present

## 2021-06-20 ENCOUNTER — Other Ambulatory Visit: Payer: Self-pay

## 2021-06-20 ENCOUNTER — Ambulatory Visit
Admission: RE | Admit: 2021-06-20 | Discharge: 2021-06-20 | Disposition: A | Discharge status: Home / Self Care | Payer: No Typology Code available for payment source | Source: Ambulatory Visit | Attending: Radiation Oncology | Admitting: Radiation Oncology

## 2021-06-20 DIAGNOSIS — Z510 Encounter for antineoplastic radiation therapy: Secondary | ICD-10-CM | POA: Diagnosis not present

## 2021-06-21 ENCOUNTER — Ambulatory Visit
Admission: RE | Admit: 2021-06-21 | Discharge: 2021-06-21 | Disposition: A | Discharge status: Home / Self Care | Payer: No Typology Code available for payment source | Source: Ambulatory Visit | Attending: Radiation Oncology | Admitting: Radiation Oncology

## 2021-06-21 DIAGNOSIS — Z510 Encounter for antineoplastic radiation therapy: Secondary | ICD-10-CM | POA: Diagnosis not present

## 2021-06-22 ENCOUNTER — Ambulatory Visit
Admission: RE | Admit: 2021-06-22 | Discharge: 2021-06-22 | Disposition: A | Discharge status: Home / Self Care | Payer: No Typology Code available for payment source | Source: Ambulatory Visit | Attending: Radiation Oncology | Admitting: Radiation Oncology

## 2021-06-22 ENCOUNTER — Other Ambulatory Visit: Payer: Self-pay

## 2021-06-22 DIAGNOSIS — Z510 Encounter for antineoplastic radiation therapy: Secondary | ICD-10-CM | POA: Diagnosis not present

## 2021-06-23 ENCOUNTER — Ambulatory Visit
Admission: RE | Admit: 2021-06-23 | Discharge: 2021-06-23 | Disposition: A | Discharge status: Home / Self Care | Payer: No Typology Code available for payment source | Source: Ambulatory Visit | Attending: Radiation Oncology | Admitting: Radiation Oncology

## 2021-06-23 DIAGNOSIS — Z510 Encounter for antineoplastic radiation therapy: Secondary | ICD-10-CM | POA: Diagnosis not present

## 2021-06-24 ENCOUNTER — Ambulatory Visit
Admission: RE | Admit: 2021-06-24 | Discharge: 2021-06-24 | Disposition: A | Discharge status: Home / Self Care | Payer: No Typology Code available for payment source | Source: Ambulatory Visit | Attending: Radiation Oncology | Admitting: Radiation Oncology

## 2021-06-24 ENCOUNTER — Other Ambulatory Visit: Payer: Self-pay

## 2021-06-24 DIAGNOSIS — Z510 Encounter for antineoplastic radiation therapy: Secondary | ICD-10-CM | POA: Diagnosis not present

## 2021-06-28 ENCOUNTER — Other Ambulatory Visit: Payer: Self-pay

## 2021-06-28 ENCOUNTER — Ambulatory Visit
Admission: RE | Admit: 2021-06-28 | Discharge: 2021-06-28 | Disposition: A | Discharge status: Home / Self Care | Payer: No Typology Code available for payment source | Source: Ambulatory Visit | Attending: Radiation Oncology | Admitting: Radiation Oncology

## 2021-06-28 DIAGNOSIS — Z510 Encounter for antineoplastic radiation therapy: Secondary | ICD-10-CM | POA: Diagnosis not present

## 2021-06-29 ENCOUNTER — Ambulatory Visit
Admission: RE | Admit: 2021-06-29 | Discharge: 2021-06-29 | Disposition: A | Discharge status: Home / Self Care | Payer: No Typology Code available for payment source | Source: Ambulatory Visit | Attending: Radiation Oncology | Admitting: Radiation Oncology

## 2021-06-29 DIAGNOSIS — Z510 Encounter for antineoplastic radiation therapy: Secondary | ICD-10-CM | POA: Diagnosis not present

## 2021-06-30 ENCOUNTER — Ambulatory Visit
Admission: RE | Admit: 2021-06-30 | Discharge: 2021-06-30 | Disposition: A | Discharge status: Home / Self Care | Payer: No Typology Code available for payment source | Source: Ambulatory Visit | Attending: Radiation Oncology | Admitting: Radiation Oncology

## 2021-06-30 ENCOUNTER — Other Ambulatory Visit: Payer: Self-pay

## 2021-06-30 DIAGNOSIS — Z510 Encounter for antineoplastic radiation therapy: Secondary | ICD-10-CM | POA: Diagnosis not present

## 2021-07-01 ENCOUNTER — Ambulatory Visit
Admission: RE | Admit: 2021-07-01 | Discharge: 2021-07-01 | Disposition: A | Discharge status: Home / Self Care | Payer: No Typology Code available for payment source | Source: Ambulatory Visit | Attending: Radiation Oncology | Admitting: Radiation Oncology

## 2021-07-01 DIAGNOSIS — Z510 Encounter for antineoplastic radiation therapy: Secondary | ICD-10-CM | POA: Diagnosis not present

## 2021-07-05 ENCOUNTER — Other Ambulatory Visit: Payer: Self-pay

## 2021-07-05 ENCOUNTER — Ambulatory Visit
Admission: RE | Admit: 2021-07-05 | Discharge: 2021-07-05 | Disposition: A | Discharge status: Home / Self Care | Payer: No Typology Code available for payment source | Source: Ambulatory Visit | Attending: Radiation Oncology | Admitting: Radiation Oncology

## 2021-07-05 DIAGNOSIS — Z510 Encounter for antineoplastic radiation therapy: Secondary | ICD-10-CM | POA: Insufficient documentation

## 2021-07-05 DIAGNOSIS — C61 Malignant neoplasm of prostate: Secondary | ICD-10-CM | POA: Diagnosis present

## 2021-07-06 ENCOUNTER — Ambulatory Visit
Admission: RE | Admit: 2021-07-06 | Discharge: 2021-07-06 | Disposition: A | Discharge status: Home / Self Care | Payer: No Typology Code available for payment source | Source: Ambulatory Visit | Attending: Radiation Oncology | Admitting: Radiation Oncology

## 2021-07-06 DIAGNOSIS — Z510 Encounter for antineoplastic radiation therapy: Secondary | ICD-10-CM | POA: Diagnosis not present

## 2021-07-07 ENCOUNTER — Ambulatory Visit
Admission: RE | Admit: 2021-07-07 | Discharge: 2021-07-07 | Disposition: A | Discharge status: Home / Self Care | Payer: No Typology Code available for payment source | Source: Ambulatory Visit | Attending: Radiation Oncology | Admitting: Radiation Oncology

## 2021-07-07 ENCOUNTER — Other Ambulatory Visit: Payer: Self-pay

## 2021-07-07 DIAGNOSIS — Z510 Encounter for antineoplastic radiation therapy: Secondary | ICD-10-CM | POA: Diagnosis not present

## 2021-07-08 ENCOUNTER — Ambulatory Visit
Admission: RE | Admit: 2021-07-08 | Discharge: 2021-07-08 | Disposition: A | Discharge status: Home / Self Care | Payer: No Typology Code available for payment source | Source: Ambulatory Visit | Attending: Radiation Oncology | Admitting: Radiation Oncology

## 2021-07-08 DIAGNOSIS — Z510 Encounter for antineoplastic radiation therapy: Secondary | ICD-10-CM | POA: Diagnosis not present

## 2021-07-11 ENCOUNTER — Other Ambulatory Visit: Payer: Self-pay

## 2021-07-11 ENCOUNTER — Ambulatory Visit
Admission: RE | Admit: 2021-07-11 | Discharge: 2021-07-11 | Disposition: A | Discharge status: Home / Self Care | Payer: No Typology Code available for payment source | Source: Ambulatory Visit | Attending: Radiation Oncology | Admitting: Radiation Oncology

## 2021-07-11 DIAGNOSIS — Z510 Encounter for antineoplastic radiation therapy: Secondary | ICD-10-CM | POA: Diagnosis not present

## 2021-07-12 ENCOUNTER — Ambulatory Visit
Admission: RE | Admit: 2021-07-12 | Discharge: 2021-07-12 | Disposition: A | Discharge status: Home / Self Care | Payer: No Typology Code available for payment source | Source: Ambulatory Visit | Attending: Radiation Oncology | Admitting: Radiation Oncology

## 2021-07-12 DIAGNOSIS — Z510 Encounter for antineoplastic radiation therapy: Secondary | ICD-10-CM | POA: Diagnosis not present

## 2021-07-13 ENCOUNTER — Other Ambulatory Visit: Payer: Self-pay

## 2021-07-13 ENCOUNTER — Ambulatory Visit
Admission: RE | Admit: 2021-07-13 | Discharge: 2021-07-13 | Disposition: A | Discharge status: Home / Self Care | Payer: No Typology Code available for payment source | Source: Ambulatory Visit | Attending: Radiation Oncology | Admitting: Radiation Oncology

## 2021-07-13 DIAGNOSIS — Z510 Encounter for antineoplastic radiation therapy: Secondary | ICD-10-CM | POA: Diagnosis not present

## 2021-07-14 ENCOUNTER — Ambulatory Visit
Admission: RE | Admit: 2021-07-14 | Discharge: 2021-07-14 | Disposition: A | Discharge status: Home / Self Care | Payer: No Typology Code available for payment source | Source: Ambulatory Visit | Attending: Radiation Oncology | Admitting: Radiation Oncology

## 2021-07-14 DIAGNOSIS — Z510 Encounter for antineoplastic radiation therapy: Secondary | ICD-10-CM | POA: Diagnosis not present

## 2021-07-15 ENCOUNTER — Other Ambulatory Visit: Payer: Self-pay

## 2021-07-15 ENCOUNTER — Ambulatory Visit
Admission: RE | Admit: 2021-07-15 | Discharge: 2021-07-15 | Disposition: A | Discharge status: Home / Self Care | Payer: No Typology Code available for payment source | Source: Ambulatory Visit | Attending: Radiation Oncology | Admitting: Radiation Oncology

## 2021-07-15 DIAGNOSIS — Z510 Encounter for antineoplastic radiation therapy: Secondary | ICD-10-CM | POA: Diagnosis not present

## 2021-07-18 ENCOUNTER — Other Ambulatory Visit: Payer: Self-pay

## 2021-07-18 ENCOUNTER — Ambulatory Visit
Admission: RE | Admit: 2021-07-18 | Discharge: 2021-07-18 | Disposition: A | Discharge status: Home / Self Care | Payer: No Typology Code available for payment source | Source: Ambulatory Visit | Attending: Radiation Oncology | Admitting: Radiation Oncology

## 2021-07-18 DIAGNOSIS — Z510 Encounter for antineoplastic radiation therapy: Secondary | ICD-10-CM | POA: Diagnosis not present

## 2021-07-19 ENCOUNTER — Ambulatory Visit
Admission: RE | Admit: 2021-07-19 | Discharge: 2021-07-19 | Disposition: A | Discharge status: Home / Self Care | Payer: No Typology Code available for payment source | Source: Ambulatory Visit | Attending: Radiation Oncology | Admitting: Radiation Oncology

## 2021-07-19 DIAGNOSIS — Z510 Encounter for antineoplastic radiation therapy: Secondary | ICD-10-CM | POA: Diagnosis not present

## 2021-07-20 ENCOUNTER — Ambulatory Visit
Admission: RE | Admit: 2021-07-20 | Discharge: 2021-07-20 | Disposition: A | Discharge status: Home / Self Care | Payer: No Typology Code available for payment source | Source: Ambulatory Visit | Attending: Radiation Oncology | Admitting: Radiation Oncology

## 2021-07-20 ENCOUNTER — Other Ambulatory Visit: Payer: Self-pay

## 2021-07-20 DIAGNOSIS — Z510 Encounter for antineoplastic radiation therapy: Secondary | ICD-10-CM | POA: Diagnosis not present

## 2021-07-21 ENCOUNTER — Ambulatory Visit
Admission: RE | Admit: 2021-07-21 | Discharge: 2021-07-21 | Disposition: A | Discharge status: Home / Self Care | Payer: No Typology Code available for payment source | Source: Ambulatory Visit | Attending: Radiation Oncology | Admitting: Radiation Oncology

## 2021-07-21 DIAGNOSIS — Z510 Encounter for antineoplastic radiation therapy: Secondary | ICD-10-CM | POA: Diagnosis not present

## 2021-07-22 ENCOUNTER — Ambulatory Visit
Admission: RE | Admit: 2021-07-22 | Discharge: 2021-07-22 | Disposition: A | Discharge status: Home / Self Care | Payer: No Typology Code available for payment source | Source: Ambulatory Visit | Attending: Radiation Oncology | Admitting: Radiation Oncology

## 2021-07-22 ENCOUNTER — Other Ambulatory Visit: Payer: Self-pay

## 2021-07-22 DIAGNOSIS — Z510 Encounter for antineoplastic radiation therapy: Secondary | ICD-10-CM | POA: Diagnosis not present

## 2021-07-25 ENCOUNTER — Other Ambulatory Visit: Payer: Self-pay

## 2021-07-25 ENCOUNTER — Ambulatory Visit
Admission: RE | Admit: 2021-07-25 | Discharge: 2021-07-25 | Disposition: A | Discharge status: Home / Self Care | Payer: No Typology Code available for payment source | Source: Ambulatory Visit | Attending: Radiation Oncology | Admitting: Radiation Oncology

## 2021-07-25 DIAGNOSIS — Z510 Encounter for antineoplastic radiation therapy: Secondary | ICD-10-CM | POA: Diagnosis not present

## 2021-07-26 ENCOUNTER — Ambulatory Visit
Admission: RE | Admit: 2021-07-26 | Discharge: 2021-07-26 | Disposition: A | Discharge status: Home / Self Care | Payer: No Typology Code available for payment source | Source: Ambulatory Visit | Attending: Radiation Oncology | Admitting: Radiation Oncology

## 2021-07-26 DIAGNOSIS — Z510 Encounter for antineoplastic radiation therapy: Secondary | ICD-10-CM | POA: Diagnosis not present

## 2021-07-27 ENCOUNTER — Ambulatory Visit
Admission: RE | Admit: 2021-07-27 | Discharge: 2021-07-27 | Disposition: A | Discharge status: Home / Self Care | Payer: No Typology Code available for payment source | Source: Ambulatory Visit | Attending: Radiation Oncology | Admitting: Radiation Oncology

## 2021-07-27 DIAGNOSIS — Z510 Encounter for antineoplastic radiation therapy: Secondary | ICD-10-CM | POA: Diagnosis not present

## 2021-07-28 ENCOUNTER — Ambulatory Visit
Admission: RE | Admit: 2021-07-28 | Discharge: 2021-07-28 | Disposition: A | Discharge status: Home / Self Care | Payer: No Typology Code available for payment source | Source: Ambulatory Visit | Attending: Radiation Oncology | Admitting: Radiation Oncology

## 2021-07-28 ENCOUNTER — Other Ambulatory Visit: Payer: Self-pay

## 2021-07-28 DIAGNOSIS — Z510 Encounter for antineoplastic radiation therapy: Secondary | ICD-10-CM | POA: Diagnosis not present

## 2021-07-29 ENCOUNTER — Ambulatory Visit
Admission: RE | Admit: 2021-07-29 | Discharge: 2021-07-29 | Disposition: A | Discharge status: Home / Self Care | Payer: No Typology Code available for payment source | Source: Ambulatory Visit | Attending: Radiation Oncology | Admitting: Radiation Oncology

## 2021-07-29 DIAGNOSIS — Z510 Encounter for antineoplastic radiation therapy: Secondary | ICD-10-CM | POA: Diagnosis not present

## 2021-08-01 ENCOUNTER — Ambulatory Visit
Admission: RE | Admit: 2021-08-01 | Discharge: 2021-08-01 | Disposition: A | Discharge status: Home / Self Care | Payer: No Typology Code available for payment source | Source: Ambulatory Visit | Attending: Radiation Oncology | Admitting: Radiation Oncology

## 2021-08-01 ENCOUNTER — Other Ambulatory Visit: Payer: Self-pay

## 2021-08-01 DIAGNOSIS — Z510 Encounter for antineoplastic radiation therapy: Secondary | ICD-10-CM | POA: Diagnosis not present

## 2021-08-02 ENCOUNTER — Ambulatory Visit
Admission: RE | Admit: 2021-08-02 | Discharge: 2021-08-02 | Disposition: A | Discharge status: Home / Self Care | Payer: No Typology Code available for payment source | Source: Ambulatory Visit | Attending: Radiation Oncology | Admitting: Radiation Oncology

## 2021-08-02 DIAGNOSIS — Z510 Encounter for antineoplastic radiation therapy: Secondary | ICD-10-CM | POA: Diagnosis not present

## 2021-08-03 ENCOUNTER — Encounter: Payer: Self-pay | Admitting: Radiation Oncology

## 2021-08-03 ENCOUNTER — Ambulatory Visit
Admission: RE | Admit: 2021-08-03 | Discharge: 2021-08-03 | Disposition: A | Discharge status: Home / Self Care | Payer: No Typology Code available for payment source | Source: Ambulatory Visit | Attending: Radiation Oncology | Admitting: Radiation Oncology

## 2021-08-03 ENCOUNTER — Other Ambulatory Visit: Payer: Self-pay

## 2021-08-03 DIAGNOSIS — C61 Malignant neoplasm of prostate: Secondary | ICD-10-CM | POA: Insufficient documentation

## 2021-08-03 DIAGNOSIS — Z510 Encounter for antineoplastic radiation therapy: Secondary | ICD-10-CM | POA: Insufficient documentation

## 2022-03-20 ENCOUNTER — Other Ambulatory Visit (HOSPITAL_COMMUNITY): Payer: Self-pay | Admitting: Internal Medicine

## 2022-03-20 DIAGNOSIS — E119 Type 2 diabetes mellitus without complications: Secondary | ICD-10-CM

## 2022-03-20 DIAGNOSIS — I10 Essential (primary) hypertension: Secondary | ICD-10-CM

## 2022-03-20 DIAGNOSIS — G459 Transient cerebral ischemic attack, unspecified: Secondary | ICD-10-CM

## 2022-03-21 ENCOUNTER — Ambulatory Visit (HOSPITAL_COMMUNITY)
Admission: RE | Admit: 2022-03-21 | Discharge: 2022-03-21 | Disposition: A | Discharge status: Home / Self Care | Payer: No Typology Code available for payment source | Source: Ambulatory Visit | Attending: Internal Medicine | Admitting: Internal Medicine

## 2022-03-21 DIAGNOSIS — E119 Type 2 diabetes mellitus without complications: Secondary | ICD-10-CM | POA: Diagnosis not present

## 2022-03-21 DIAGNOSIS — G459 Transient cerebral ischemic attack, unspecified: Secondary | ICD-10-CM | POA: Insufficient documentation

## 2022-03-21 DIAGNOSIS — I10 Essential (primary) hypertension: Secondary | ICD-10-CM | POA: Diagnosis present

## 2022-03-21 NOTE — Progress Notes (Signed)
Carotid duplex has been completed.   Preliminary results in CV Proc.   Mike James 03/21/2022 1:11 PM

## 2022-09-06 DIAGNOSIS — I152 Hypertension secondary to endocrine disorders: Secondary | ICD-10-CM | POA: Diagnosis not present

## 2022-09-06 DIAGNOSIS — E1169 Type 2 diabetes mellitus with other specified complication: Secondary | ICD-10-CM | POA: Diagnosis not present

## 2022-09-06 DIAGNOSIS — E785 Hyperlipidemia, unspecified: Secondary | ICD-10-CM | POA: Diagnosis not present

## 2022-09-06 DIAGNOSIS — Z8546 Personal history of malignant neoplasm of prostate: Secondary | ICD-10-CM | POA: Diagnosis not present

## 2022-09-06 DIAGNOSIS — E1159 Type 2 diabetes mellitus with other circulatory complications: Secondary | ICD-10-CM | POA: Diagnosis not present

## 2022-09-06 DIAGNOSIS — Z8679 Personal history of other diseases of the circulatory system: Secondary | ICD-10-CM | POA: Diagnosis not present

## 2022-09-06 DIAGNOSIS — Z9889 Other specified postprocedural states: Secondary | ICD-10-CM | POA: Diagnosis not present

## 2022-09-06 DIAGNOSIS — I2692 Saddle embolus of pulmonary artery without acute cor pulmonale: Secondary | ICD-10-CM | POA: Diagnosis not present

## 2022-09-06 DIAGNOSIS — Z1331 Encounter for screening for depression: Secondary | ICD-10-CM | POA: Diagnosis not present

## 2022-09-06 DIAGNOSIS — I2699 Other pulmonary embolism without acute cor pulmonale: Secondary | ICD-10-CM | POA: Diagnosis not present

## 2022-10-10 DIAGNOSIS — H409 Unspecified glaucoma: Secondary | ICD-10-CM | POA: Diagnosis not present

## 2022-10-10 DIAGNOSIS — I4891 Unspecified atrial fibrillation: Secondary | ICD-10-CM | POA: Diagnosis not present

## 2022-10-10 DIAGNOSIS — K5900 Constipation, unspecified: Secondary | ICD-10-CM | POA: Diagnosis not present

## 2022-10-10 DIAGNOSIS — D6869 Other thrombophilia: Secondary | ICD-10-CM | POA: Diagnosis not present

## 2022-10-10 DIAGNOSIS — E669 Obesity, unspecified: Secondary | ICD-10-CM | POA: Diagnosis not present

## 2022-10-10 DIAGNOSIS — I825Z3 Chronic embolism and thrombosis of unspecified deep veins of distal lower extremity, bilateral: Secondary | ICD-10-CM | POA: Diagnosis not present

## 2022-10-10 DIAGNOSIS — H42 Glaucoma in diseases classified elsewhere: Secondary | ICD-10-CM | POA: Diagnosis not present

## 2022-10-10 DIAGNOSIS — E1169 Type 2 diabetes mellitus with other specified complication: Secondary | ICD-10-CM | POA: Diagnosis not present

## 2022-10-10 DIAGNOSIS — E785 Hyperlipidemia, unspecified: Secondary | ICD-10-CM | POA: Diagnosis not present

## 2022-10-10 DIAGNOSIS — M109 Gout, unspecified: Secondary | ICD-10-CM | POA: Diagnosis not present

## 2022-10-10 DIAGNOSIS — E1139 Type 2 diabetes mellitus with other diabetic ophthalmic complication: Secondary | ICD-10-CM | POA: Diagnosis not present

## 2022-10-10 DIAGNOSIS — E1142 Type 2 diabetes mellitus with diabetic polyneuropathy: Secondary | ICD-10-CM | POA: Diagnosis not present

## 2022-12-13 DIAGNOSIS — H35371 Puckering of macula, right eye: Secondary | ICD-10-CM | POA: Diagnosis not present

## 2022-12-13 DIAGNOSIS — H25812 Combined forms of age-related cataract, left eye: Secondary | ICD-10-CM | POA: Diagnosis not present

## 2022-12-13 DIAGNOSIS — H31091 Other chorioretinal scars, right eye: Secondary | ICD-10-CM | POA: Diagnosis not present

## 2022-12-13 DIAGNOSIS — Z961 Presence of intraocular lens: Secondary | ICD-10-CM | POA: Diagnosis not present

## 2022-12-13 DIAGNOSIS — H35453 Secondary pigmentary degeneration, bilateral: Secondary | ICD-10-CM | POA: Diagnosis not present

## 2022-12-13 DIAGNOSIS — H43392 Other vitreous opacities, left eye: Secondary | ICD-10-CM | POA: Diagnosis not present

## 2023-01-08 DIAGNOSIS — R160 Hepatomegaly, not elsewhere classified: Secondary | ICD-10-CM | POA: Diagnosis not present

## 2023-01-08 DIAGNOSIS — K625 Hemorrhage of anus and rectum: Secondary | ICD-10-CM | POA: Diagnosis not present

## 2023-01-08 DIAGNOSIS — Z87891 Personal history of nicotine dependence: Secondary | ICD-10-CM | POA: Diagnosis not present

## 2023-01-08 DIAGNOSIS — K760 Fatty (change of) liver, not elsewhere classified: Secondary | ICD-10-CM | POA: Diagnosis not present

## 2023-01-08 DIAGNOSIS — K648 Other hemorrhoids: Secondary | ICD-10-CM | POA: Diagnosis not present

## 2023-01-08 DIAGNOSIS — R739 Hyperglycemia, unspecified: Secondary | ICD-10-CM | POA: Diagnosis not present

## 2023-01-08 DIAGNOSIS — N3289 Other specified disorders of bladder: Secondary | ICD-10-CM | POA: Diagnosis not present

## 2023-01-08 DIAGNOSIS — E1165 Type 2 diabetes mellitus with hyperglycemia: Secondary | ICD-10-CM | POA: Diagnosis not present

## 2023-01-08 DIAGNOSIS — E278 Other specified disorders of adrenal gland: Secondary | ICD-10-CM | POA: Diagnosis not present

## 2023-01-08 DIAGNOSIS — Z7901 Long term (current) use of anticoagulants: Secondary | ICD-10-CM | POA: Diagnosis not present

## 2023-01-08 DIAGNOSIS — I10 Essential (primary) hypertension: Secondary | ICD-10-CM | POA: Diagnosis not present

## 2023-01-08 DIAGNOSIS — D649 Anemia, unspecified: Secondary | ICD-10-CM | POA: Diagnosis not present

## 2023-01-08 DIAGNOSIS — N3000 Acute cystitis without hematuria: Secondary | ICD-10-CM | POA: Diagnosis not present

## 2023-03-21 DIAGNOSIS — L814 Other melanin hyperpigmentation: Secondary | ICD-10-CM | POA: Diagnosis not present

## 2023-03-21 DIAGNOSIS — L821 Other seborrheic keratosis: Secondary | ICD-10-CM | POA: Diagnosis not present

## 2023-03-21 DIAGNOSIS — D485 Neoplasm of uncertain behavior of skin: Secondary | ICD-10-CM | POA: Diagnosis not present

## 2023-03-21 DIAGNOSIS — Z85828 Personal history of other malignant neoplasm of skin: Secondary | ICD-10-CM | POA: Diagnosis not present

## 2023-03-21 DIAGNOSIS — L570 Actinic keratosis: Secondary | ICD-10-CM | POA: Diagnosis not present

## 2023-05-24 DIAGNOSIS — Z13228 Encounter for screening for other metabolic disorders: Secondary | ICD-10-CM | POA: Diagnosis not present

## 2023-09-20 DIAGNOSIS — L570 Actinic keratosis: Secondary | ICD-10-CM | POA: Diagnosis not present

## 2023-09-20 DIAGNOSIS — L821 Other seborrheic keratosis: Secondary | ICD-10-CM | POA: Diagnosis not present

## 2023-09-20 DIAGNOSIS — C4442 Squamous cell carcinoma of skin of scalp and neck: Secondary | ICD-10-CM | POA: Diagnosis not present

## 2023-09-20 DIAGNOSIS — L812 Freckles: Secondary | ICD-10-CM | POA: Diagnosis not present

## 2023-09-20 DIAGNOSIS — L814 Other melanin hyperpigmentation: Secondary | ICD-10-CM | POA: Diagnosis not present

## 2023-09-20 DIAGNOSIS — D044 Carcinoma in situ of skin of scalp and neck: Secondary | ICD-10-CM | POA: Diagnosis not present

## 2023-09-20 DIAGNOSIS — C44329 Squamous cell carcinoma of skin of other parts of face: Secondary | ICD-10-CM | POA: Diagnosis not present

## 2023-09-20 DIAGNOSIS — Z85828 Personal history of other malignant neoplasm of skin: Secondary | ICD-10-CM | POA: Diagnosis not present

## 2023-12-12 DIAGNOSIS — H35371 Puckering of macula, right eye: Secondary | ICD-10-CM | POA: Diagnosis not present

## 2023-12-12 DIAGNOSIS — H59811 Chorioretinal scars after surgery for detachment, right eye: Secondary | ICD-10-CM | POA: Diagnosis not present

## 2023-12-12 DIAGNOSIS — H43392 Other vitreous opacities, left eye: Secondary | ICD-10-CM | POA: Diagnosis not present

## 2023-12-12 DIAGNOSIS — H59032 Cystoid macular edema following cataract surgery, left eye: Secondary | ICD-10-CM | POA: Diagnosis not present

## 2024-01-14 DIAGNOSIS — H59811 Chorioretinal scars after surgery for detachment, right eye: Secondary | ICD-10-CM | POA: Diagnosis not present

## 2024-01-14 DIAGNOSIS — H59032 Cystoid macular edema following cataract surgery, left eye: Secondary | ICD-10-CM | POA: Diagnosis not present

## 2024-01-14 DIAGNOSIS — H43392 Other vitreous opacities, left eye: Secondary | ICD-10-CM | POA: Diagnosis not present

## 2024-01-14 DIAGNOSIS — H35373 Puckering of macula, bilateral: Secondary | ICD-10-CM | POA: Diagnosis not present

## 2024-03-24 DIAGNOSIS — L820 Inflamed seborrheic keratosis: Secondary | ICD-10-CM | POA: Diagnosis not present

## 2024-03-24 DIAGNOSIS — D225 Melanocytic nevi of trunk: Secondary | ICD-10-CM | POA: Diagnosis not present

## 2024-03-24 DIAGNOSIS — D0461 Carcinoma in situ of skin of right upper limb, including shoulder: Secondary | ICD-10-CM | POA: Diagnosis not present

## 2024-03-24 DIAGNOSIS — D1801 Hemangioma of skin and subcutaneous tissue: Secondary | ICD-10-CM | POA: Diagnosis not present

## 2024-03-24 DIAGNOSIS — L821 Other seborrheic keratosis: Secondary | ICD-10-CM | POA: Diagnosis not present

## 2024-03-24 DIAGNOSIS — L570 Actinic keratosis: Secondary | ICD-10-CM | POA: Diagnosis not present

## 2024-03-24 DIAGNOSIS — Z85828 Personal history of other malignant neoplasm of skin: Secondary | ICD-10-CM | POA: Diagnosis not present
